# Patient Record
Sex: Female | Born: 1970 | Hispanic: Yes | Marital: Married | Smoking: Never smoker
Health system: Southern US, Community
[De-identification: ages and names within clinical notes are randomized; demographics above are authoritative.]

## PROBLEM LIST (undated history)

## (undated) DIAGNOSIS — F419 Anxiety disorder, unspecified: Secondary | ICD-10-CM

## (undated) DIAGNOSIS — D649 Anemia, unspecified: Secondary | ICD-10-CM

## (undated) DIAGNOSIS — M25511 Pain in right shoulder: Secondary | ICD-10-CM

## (undated) DIAGNOSIS — F32A Depression, unspecified: Secondary | ICD-10-CM

## (undated) DIAGNOSIS — R7303 Prediabetes: Secondary | ICD-10-CM

## (undated) DIAGNOSIS — F329 Major depressive disorder, single episode, unspecified: Secondary | ICD-10-CM

## (undated) DIAGNOSIS — G43909 Migraine, unspecified, not intractable, without status migrainosus: Secondary | ICD-10-CM

## (undated) DIAGNOSIS — N809 Endometriosis, unspecified: Secondary | ICD-10-CM

## (undated) DIAGNOSIS — Z973 Presence of spectacles and contact lenses: Secondary | ICD-10-CM

## (undated) DIAGNOSIS — R519 Headache, unspecified: Secondary | ICD-10-CM

## (undated) DIAGNOSIS — R51 Headache: Secondary | ICD-10-CM

## (undated) DIAGNOSIS — E785 Hyperlipidemia, unspecified: Secondary | ICD-10-CM

## (undated) HISTORY — DX: Endometriosis, unspecified: N80.9

## (undated) HISTORY — PX: TUBAL LIGATION: SHX77

## (undated) HISTORY — PX: ABLATION: SHX5711

## (undated) HISTORY — DX: Anemia, unspecified: D64.9

## (undated) HISTORY — DX: Migraine, unspecified, not intractable, without status migrainosus: G43.909

## (undated) HISTORY — DX: Anxiety disorder, unspecified: F41.9

---

## 1999-09-08 HISTORY — PX: TUBAL LIGATION: SHX77

## 2003-09-08 HISTORY — PX: CHOLECYSTECTOMY: SHX55

## 2006-09-07 HISTORY — PX: ABLATION: SHX5711

## 2011-08-19 ENCOUNTER — Other Ambulatory Visit (HOSPITAL_COMMUNITY): Payer: Self-pay | Admitting: Geriatric Medicine

## 2011-08-19 DIAGNOSIS — Z1231 Encounter for screening mammogram for malignant neoplasm of breast: Secondary | ICD-10-CM

## 2011-09-21 ENCOUNTER — Ambulatory Visit (HOSPITAL_COMMUNITY)
Admission: RE | Admit: 2011-09-21 | Discharge: 2011-09-21 | Disposition: A | Discharge status: Home / Self Care | Payer: Self-pay | Source: Ambulatory Visit | Attending: Geriatric Medicine | Admitting: Geriatric Medicine

## 2011-09-21 DIAGNOSIS — Z1231 Encounter for screening mammogram for malignant neoplasm of breast: Secondary | ICD-10-CM | POA: Insufficient documentation

## 2011-09-22 ENCOUNTER — Ambulatory Visit (HOSPITAL_COMMUNITY): Payer: Self-pay

## 2011-09-28 ENCOUNTER — Other Ambulatory Visit: Payer: Self-pay | Admitting: Geriatric Medicine

## 2011-09-28 DIAGNOSIS — R928 Other abnormal and inconclusive findings on diagnostic imaging of breast: Secondary | ICD-10-CM

## 2011-10-06 ENCOUNTER — Ambulatory Visit (INDEPENDENT_AMBULATORY_CARE_PROVIDER_SITE_OTHER): Payer: Self-pay | Admitting: *Deleted

## 2011-10-06 VITALS — BP 130/83 | HR 83 | Temp 97.4°F | Resp 18 | Ht 65.0 in | Wt 149.7 lb

## 2011-10-06 DIAGNOSIS — Z1239 Encounter for other screening for malignant neoplasm of breast: Secondary | ICD-10-CM

## 2011-10-06 NOTE — Patient Instructions (Signed)
Taught patient how to perform BSE and gave educational materials to take home. Patient did not need a Pap smear today due to last Pap smear was November 2012 at free Pap smear screening in [facility] per patient. Told patient about free cervical cancer screenings to receive a Pap smear if would like one this November or next Year per patient stated she has had an abnormal Pap smear in her past. Patient is scheduled for a left breast diagnostic mammogram this Thursday, October 08, 2011 at 1400. Patient aware of appointment and will be there. Let patient know will follow up with her within the next couple weeks with results. Patient verbalized understanding.

## 2011-10-06 NOTE — Progress Notes (Signed)
Pt here with spanish speaking interpreter [employer]

## 2011-10-06 NOTE — Progress Notes (Signed)
Complaints of tenderness in left breast. Referred from the [facility] for additional imaging of left breast.  Pap Smear:    Pap smear not performed today. Patients last Pap smear was November 2012 and was normal per patient. Last Pap smear was completed at the free Pap smear screening at [facility] offered by the Cancer Center. Per patient she had an abnormal Pap smear in 1999.  Suggested she go to one of the free Pap smear screenings offered by the Cancer Center either November of this year or next year due to her history and patient not sure what result were. No Pap smear results are in EPIC.  Physical exam: Breasts Breasts symmetrical. No skin abnormalities bilateral breasts. No nipple retraction bilateral breasts. No nipple discharge bilateral breasts. No lymphadenopathy. No lumps palpated bilateral breasts. Patient complained of tenderness when palpated outer quadrant of left breast. Patient referred to the [facility] for Diagnostic Mammogram and possible left breast ultrasound Thursday, October 08, 2011 at 1400 per recommendation by the [facility] from mammogram on 09/21/11 needing additional images of left breast.         Pelvic/Bimanual No Pap smear completed today since last Pap smear was November 2012 and normal per patient. Pap smear not indicated per BCCCP guidelines.

## 2011-10-08 ENCOUNTER — Ambulatory Visit
Admission: RE | Admit: 2011-10-08 | Discharge: 2011-10-08 | Disposition: A | Discharge status: Home / Self Care | Payer: No Typology Code available for payment source | Source: Ambulatory Visit | Attending: Geriatric Medicine | Admitting: Geriatric Medicine

## 2011-10-08 DIAGNOSIS — R928 Other abnormal and inconclusive findings on diagnostic imaging of breast: Secondary | ICD-10-CM

## 2011-10-16 ENCOUNTER — Telehealth: Payer: Self-pay | Admitting: *Deleted

## 2011-10-16 NOTE — Telephone Encounter (Signed)
Interpreter Tricia Lucas called patient to verify she received results from the [facility]. Patient is aware of her results and to have a screening mammogram in 1 year.

## 2014-05-03 ENCOUNTER — Emergency Department (HOSPITAL_BASED_OUTPATIENT_CLINIC_OR_DEPARTMENT_OTHER)
Admission: EM | Admit: 2014-05-03 | Discharge: 2014-05-03 | Disposition: A | Discharge status: Home / Self Care | Payer: Self-pay | Attending: Emergency Medicine | Admitting: Emergency Medicine

## 2014-05-03 ENCOUNTER — Encounter (HOSPITAL_BASED_OUTPATIENT_CLINIC_OR_DEPARTMENT_OTHER): Payer: Self-pay | Admitting: Emergency Medicine

## 2014-05-03 ENCOUNTER — Emergency Department (HOSPITAL_BASED_OUTPATIENT_CLINIC_OR_DEPARTMENT_OTHER): Payer: Self-pay

## 2014-05-03 DIAGNOSIS — Z79899 Other long term (current) drug therapy: Secondary | ICD-10-CM | POA: Insufficient documentation

## 2014-05-03 DIAGNOSIS — S199XXA Unspecified injury of neck, initial encounter: Secondary | ICD-10-CM

## 2014-05-03 DIAGNOSIS — S46819A Strain of other muscles, fascia and tendons at shoulder and upper arm level, unspecified arm, initial encounter: Secondary | ICD-10-CM | POA: Insufficient documentation

## 2014-05-03 DIAGNOSIS — S46909A Unspecified injury of unspecified muscle, fascia and tendon at shoulder and upper arm level, unspecified arm, initial encounter: Secondary | ICD-10-CM | POA: Insufficient documentation

## 2014-05-03 DIAGNOSIS — S0993XA Unspecified injury of face, initial encounter: Secondary | ICD-10-CM | POA: Insufficient documentation

## 2014-05-03 DIAGNOSIS — X58XXXA Exposure to other specified factors, initial encounter: Secondary | ICD-10-CM | POA: Insufficient documentation

## 2014-05-03 DIAGNOSIS — Y929 Unspecified place or not applicable: Secondary | ICD-10-CM | POA: Insufficient documentation

## 2014-05-03 DIAGNOSIS — S46811A Strain of other muscles, fascia and tendons at shoulder and upper arm level, right arm, initial encounter: Secondary | ICD-10-CM

## 2014-05-03 DIAGNOSIS — S4980XA Other specified injuries of shoulder and upper arm, unspecified arm, initial encounter: Secondary | ICD-10-CM | POA: Insufficient documentation

## 2014-05-03 DIAGNOSIS — F329 Major depressive disorder, single episode, unspecified: Secondary | ICD-10-CM | POA: Insufficient documentation

## 2014-05-03 DIAGNOSIS — Y939 Activity, unspecified: Secondary | ICD-10-CM | POA: Insufficient documentation

## 2014-05-03 DIAGNOSIS — F3289 Other specified depressive episodes: Secondary | ICD-10-CM | POA: Insufficient documentation

## 2014-05-03 HISTORY — DX: Headache: R51

## 2014-05-03 HISTORY — DX: Major depressive disorder, single episode, unspecified: F32.9

## 2014-05-03 HISTORY — DX: Headache, unspecified: R51.9

## 2014-05-03 HISTORY — DX: Depression, unspecified: F32.A

## 2014-05-03 MED ORDER — IBUPROFEN 800 MG PO TABS: 800.0000 mg | ORAL_TABLET | Freq: Three times a day (TID) | ORAL | Status: DC

## 2014-05-03 MED ORDER — HYDROCODONE-ACETAMINOPHEN 5-325 MG PO TABS
2.0000 | ORAL_TABLET | Freq: Once | ORAL | Status: AC
Start: 2014-05-03 — End: 2014-05-03
  Administered 2014-05-03: 2 via ORAL
  Filled 2014-05-03: qty 2

## 2014-05-03 MED ORDER — HYDROCODONE-ACETAMINOPHEN 5-325 MG PO TABS: 2.0000 | ORAL_TABLET | ORAL | Status: DC | PRN

## 2014-05-03 NOTE — Discharge Instructions (Signed)

## 2014-05-03 NOTE — ED Provider Notes (Signed)
CSN: [id number]     Arrival date & time 05/03/14  1341 History   First MD Initiated Contact with Patient 05/03/14 1402     Chief Complaint  Patient presents with  . Shoulder Pain     (Consider location/radiation/quality/duration/timing/severity/associated sxs/prior Treatment) Patient is a 43 y.o. female presenting with shoulder pain. The history is provided by the patient. No language interpreter was used.  Shoulder Pain This is a new problem. The current episode started yesterday. The problem occurs constantly. The problem has been gradually worsening. Associated symptoms include joint swelling, myalgias and neck pain. Nothing aggravates the symptoms. She has tried nothing for the symptoms. The treatment provided moderate relief.    Past Medical History  Diagnosis Date  . Depression   . Headache    Past Surgical History  Procedure Laterality Date  . Cesarean section     Family History  Problem Relation Age of Onset  . Diabetes Paternal Grandfather   . Diabetes Paternal Grandmother   . Diabetes Maternal Grandmother   . Diabetes Maternal Grandfather    History  Substance Use Topics  . Smoking status: Never Smoker   . Smokeless tobacco: Not on file  . Alcohol Use: No   OB History   Grav Para Term Preterm Abortions TAB SAB Ect Mult Living   2         2     Review of Systems  Musculoskeletal: Positive for joint swelling, myalgias and neck pain.  All other systems reviewed and are negative.     Allergies  Review of patient's allergies indicates no known allergies.  Home Medications   Prior to Admission medications   Medication Sig Start Date End Date Taking? Authorizing Provider  buPROPion (ZYBAN) 150 MG 12 hr tablet Take 150 mg by mouth 2 (two) times daily.   Yes Historical Provider, MD  Topiramate ER (TROKENDI XR) 50 MG CP24 Take by mouth.   Yes Historical Provider, MD   There were no vitals taken for this visit. Physical Exam  Nursing note and vitals  reviewed. Constitutional: She is oriented to person, place, and time. She appears well-developed and well-nourished.  HENT:  Head: Normocephalic and atraumatic.  Eyes: Pupils are equal, round, and reactive to light.  Neck: Normal range of motion.  Cardiovascular: Normal rate.   Pulmonary/Chest: Effort normal and breath sounds normal.  Abdominal: Soft.  Musculoskeletal: She exhibits tenderness.  Right shoulder  Minimally tender,  Pain posterior cervical muscles/trapezius/  Tender scapular area.  Neurological: She is alert and oriented to person, place, and time.  Skin: Skin is warm.  Psychiatric: She has a normal mood and affect.    ED Course  Procedures (including critical care time) Labs Review Labs Reviewed - No data to display  Imaging Review No results found.   EKG Interpretation None      MDM   Final diagnoses:  None    Chest xray negative   Rx for ibuprofen hydrocodone Follow up with Dr. Barbaraann Barthel for recheck  Fransico Meadow, PA-C 05/03/14 1535

## 2014-05-03 NOTE — ED Notes (Signed)
Right shoulder pain since last night. Worse when she takes a deep breath. No injury. Able to move her arm with no difficulty.

## 2014-05-07 NOTE — ED Provider Notes (Signed)
Medical screening examination/treatment/procedure(s) were performed by non-physician practitioner and as supervising physician I was immediately available for consultation/collaboration.   EKG Interpretation None        Malvin Johns, MD 05/07/14 [id number]

## 2014-07-09 ENCOUNTER — Encounter (HOSPITAL_BASED_OUTPATIENT_CLINIC_OR_DEPARTMENT_OTHER): Payer: Self-pay | Admitting: Emergency Medicine

## 2015-10-21 ENCOUNTER — Encounter (HOSPITAL_BASED_OUTPATIENT_CLINIC_OR_DEPARTMENT_OTHER): Payer: Self-pay | Admitting: Emergency Medicine

## 2015-10-21 ENCOUNTER — Emergency Department (HOSPITAL_BASED_OUTPATIENT_CLINIC_OR_DEPARTMENT_OTHER)
Admission: EM | Admit: 2015-10-21 | Discharge: 2015-10-22 | Disposition: A | Discharge status: Home / Self Care | Payer: Self-pay | Attending: Emergency Medicine | Admitting: Emergency Medicine

## 2015-10-21 ENCOUNTER — Emergency Department (HOSPITAL_BASED_OUTPATIENT_CLINIC_OR_DEPARTMENT_OTHER): Payer: Self-pay

## 2015-10-21 DIAGNOSIS — R519 Headache, unspecified: Secondary | ICD-10-CM

## 2015-10-21 DIAGNOSIS — R42 Dizziness and giddiness: Secondary | ICD-10-CM | POA: Insufficient documentation

## 2015-10-21 DIAGNOSIS — Z79899 Other long term (current) drug therapy: Secondary | ICD-10-CM | POA: Insufficient documentation

## 2015-10-21 DIAGNOSIS — Z3202 Encounter for pregnancy test, result negative: Secondary | ICD-10-CM | POA: Insufficient documentation

## 2015-10-21 DIAGNOSIS — F329 Major depressive disorder, single episode, unspecified: Secondary | ICD-10-CM | POA: Insufficient documentation

## 2015-10-21 DIAGNOSIS — R51 Headache: Secondary | ICD-10-CM | POA: Insufficient documentation

## 2015-10-21 LAB — CBC WITH DIFFERENTIAL/PLATELET
Basophils Absolute: 0 10*3/uL (ref 0.0–0.1)
Basophils Relative: 0 %
Eosinophils Absolute: 0.1 10*3/uL (ref 0.0–0.7)
Eosinophils Relative: 1 %
HCT: 43.5 % (ref 36.0–46.0)
Hemoglobin: 14.7 g/dL (ref 12.0–15.0)
Lymphocytes Relative: 34 %
Lymphs Abs: 3.5 10*3/uL (ref 0.7–4.0)
MCH: 30.7 pg (ref 26.0–34.0)
MCHC: 33.8 g/dL (ref 30.0–36.0)
MCV: 90.8 fL (ref 78.0–100.0)
Monocytes Absolute: 0.7 10*3/uL (ref 0.1–1.0)
Monocytes Relative: 7 %
Neutro Abs: 6.1 10*3/uL (ref 1.7–7.7)
Neutrophils Relative %: 58 %
Platelets: 328 10*3/uL (ref 150–400)
RBC: 4.79 MIL/uL (ref 3.87–5.11)
RDW: 12.6 % (ref 11.5–15.5)
WBC: 10.4 10*3/uL (ref 4.0–10.5)

## 2015-10-21 LAB — BASIC METABOLIC PANEL
Anion gap: 13 (ref 5–15)
BUN: 11 mg/dL (ref 6–20)
CO2: 24 mmol/L (ref 22–32)
Calcium: 9.7 mg/dL (ref 8.9–10.3)
Chloride: 102 mmol/L (ref 101–111)
Creatinine, Ser: 0.82 mg/dL (ref 0.44–1.00)
GFR calc Af Amer: >60 mL/min (ref >60)
GFR calc non Af Amer: >60 mL/min (ref >60)
Glucose, Bld: 119 mg/dL — ABNORMAL HIGH (ref 65–99)
Potassium: 3.5 mmol/L (ref 3.5–5.1)
Sodium: 139 mmol/L (ref 135–145)

## 2015-10-21 LAB — HCG, QUANTITATIVE, PREGNANCY: hCG, Beta Chain, Quant, S: <1 m[IU]/mL (ref <5)

## 2015-10-21 MED ORDER — DIPHENHYDRAMINE HCL 50 MG/ML IJ SOLN
12.5000 mg | Freq: Once | INTRAMUSCULAR | Status: AC
  Administered 2015-10-21: 12.5 mg via INTRAVENOUS
  Filled 2015-10-21: qty 1

## 2015-10-21 MED ORDER — ONDANSETRON HCL 4 MG/2ML IJ SOLN
4.0000 mg | Freq: Once | INTRAMUSCULAR | Status: AC
  Administered 2015-10-21: 4 mg via INTRAVENOUS
  Filled 2015-10-21: qty 2

## 2015-10-21 MED ORDER — METOCLOPRAMIDE HCL 5 MG/ML IJ SOLN
10.0000 mg | Freq: Once | INTRAMUSCULAR | Status: AC
  Administered 2015-10-21: 10 mg via INTRAVENOUS
  Filled 2015-10-21: qty 2

## 2015-10-21 NOTE — ED Notes (Signed)
Pt placed on auto vitals Q30.  

## 2015-10-21 NOTE — ED Provider Notes (Signed)
CSN: [id number]     Arrival date & time 10/21/15  2218 History   First MD Initiated Contact with Patient 10/21/15 2256     Chief Complaint  Patient presents with  . Migraine    HPI   Tricia Lucas is a 45 y.o. female with a PMH of depression, headaches who presents to the ED with headache, which she states started yesterday and has progressively worsened since that time. She reports her symptoms have been constant. She denies exacerbating factors. She denies fever, chills. She notes associated lightheadedness. She denies dizziness, vision changes, neck pain. She states she has a history of headaches, however has not had a headache since she was 45 years old.   Past Medical History  Diagnosis Date  . Depression   . Headache    Past Surgical History  Procedure Laterality Date  . Cesarean section    . Ablation     Family History  Problem Relation Age of Onset  . Diabetes Paternal Grandfather   . Diabetes Paternal Grandmother   . Diabetes Maternal Grandmother   . Diabetes Maternal Grandfather    Social History  Substance Use Topics  . Smoking status: Never Smoker   . Smokeless tobacco: None  . Alcohol Use: No   OB History    Gravida Para Term Preterm AB TAB SAB Ectopic Multiple Living   2         2       Review of Systems  Constitutional: Negative for fever and chills.  Musculoskeletal: Negative for neck pain.  Neurological: Positive for light-headedness and headaches. Negative for dizziness, syncope, weakness and numbness.  All other systems reviewed and are negative.     Allergies  Review of patient's allergies indicates no known allergies.  Home Medications   Prior to Admission medications   Medication Sig Start Date End Date Taking? Authorizing Provider  buPROPion (ZYBAN) 150 MG 12 hr tablet Take 150 mg by mouth 2 (two) times daily.    Historical Provider, MD  HYDROcodone-acetaminophen (NORCO/VICODIN) 5-325 MG per tablet Take 2 tablets by mouth every 4 (four)  hours as needed. 05/03/14   Fransico Meadow, PA-C  ibuprofen (ADVIL,MOTRIN) 800 MG tablet Take 1 tablet (800 mg total) by mouth 3 (three) times daily. 05/03/14   Fransico Meadow, PA-C  Topiramate ER (TROKENDI XR) 50 MG CP24 Take by mouth.    Historical Provider, MD    BP 126/77 mmHg  Pulse 82  Temp(Src) 97.9 F (36.6 C) (Oral)  Resp 18  Ht 5\' 5"  (1.651 m)  Wt 74.844 kg  BMI 27.46 kg/m2  SpO2 100% Physical Exam  Constitutional: She is oriented to person, place, and time. She appears well-developed and well-nourished. No distress.  Patient appears uncomfortable due to pain.  HENT:  Head: Normocephalic and atraumatic.  Right Ear: External ear normal.  Left Ear: External ear normal.  Nose: Nose normal.  Mouth/Throat: Uvula is midline, oropharynx is clear and moist and mucous membranes are normal.  Eyes: Conjunctivae, EOM and lids are normal. Pupils are equal, round, and reactive to light. Right eye exhibits no discharge. Left eye exhibits no discharge. No scleral icterus.  Neck: Normal range of motion. Neck supple.  Cardiovascular: Normal rate, regular rhythm, normal heart sounds, intact distal pulses and normal pulses.   Pulmonary/Chest: Effort normal and breath sounds normal. No respiratory distress. She has no wheezes. She has no rales.  Abdominal: Soft. Normal appearance and bowel sounds are normal. She exhibits no distension  and no mass. There is no tenderness. There is no rigidity, no rebound and no guarding.  Musculoskeletal: Normal range of motion. She exhibits no edema or tenderness.  Neurological: She is alert and oriented to person, place, and time. She has normal strength. No cranial nerve deficit or sensory deficit.  Skin: Skin is warm, dry and intact. No rash noted. She is not diaphoretic. No erythema. No pallor.  Psychiatric: She has a normal mood and affect. Her speech is normal and behavior is normal.  Nursing note and vitals reviewed.   ED Course  Procedures (including  critical care time)  Labs Review Labs Reviewed  BASIC METABOLIC PANEL - Abnormal; Notable for the following:    Glucose, Bld 119 (*)    All other components within normal limits  CBC WITH DIFFERENTIAL/PLATELET  HCG, QUANTITATIVE, PREGNANCY    Imaging Review Ct Head Wo Contrast  10/22/2015  CLINICAL DATA:  Severe headache for 2 days. Nausea and vomiting. No history of injury. EXAM: CT HEAD WITHOUT CONTRAST TECHNIQUE: Contiguous axial images were obtained from the base of the skull through the vertex without intravenous contrast. COMPARISON:  None. FINDINGS: Ventricles and sulci appear symmetrical. No mass effect or midline shift. No abnormal extra-axial fluid collections. Gray-white matter junctions are distinct. Basal cisterns are not effaced. No evidence of acute intracranial hemorrhage. No depressed skull fractures. Visualized paranasal sinuses and mastoid air cells are not opacified. IMPRESSION: No acute intracranial abnormalities. Electronically Signed   By: Lucienne Capers M.D.   On: 10/22/2015 00:18   I have personally reviewed and evaluated these images and lab results as part of my medical decision-making.   EKG Interpretation None      MDM   Final diagnoses:  Headache    45 year old female presents with headache, which she states started yesterday and has progressively worsened since that time (no thunderclap onset). Notes she has a history of headaches, though has not had a migraine since she was 46 years old. Denies fever, chills, dizziness, vision changes, neck pain.   Patient is afebrile. Vital signs stable. Normal neuro exam with no focal deficit. No nuchal rigidity. CBC negative for leukocytosis or anemia. BMP unremarkable. HCG negative.  Head CT negative for acute intracranial abnormalities.  Given fluids, benadryl, reglan, toradol. On reassessment of patient, she reports symptom resolution.  Symptoms likely due to migraine. Low suspicion for ICH, SAH, meningitis.  Patient is non-toxic and well-appearing, feel she is stable for discharge at this time. Patient to follow-up with PCP. Return precautions discussed. Patient verbalizes her understanding and is in agreement with plan.  BP 126/77 mmHg  Pulse 82  Temp(Src) 97.9 F (36.6 C) (Oral)  Resp 18  Ht 5\' 5"  (1.651 m)  Wt 74.844 kg  BMI 27.46 kg/m2  SpO2 100%       Marella Chimes, PA-C 10/22/15 0149  Shanon Rosser, MD 10/22/15 [id number]

## 2015-10-21 NOTE — ED Notes (Signed)
Patient transported to CT 

## 2015-10-21 NOTE — ED Notes (Signed)
History of migraines. States this time since yesterday. Has active vomiting at triage.

## 2015-10-21 NOTE — ED Notes (Signed)
Pt c/o migraine type headache since yesterday that is not relieved by her regular migraine medication sumitriptan, pt actively vomiting in assessment.

## 2015-10-22 MED ORDER — KETOROLAC TROMETHAMINE 30 MG/ML IJ SOLN
30.0000 mg | Freq: Once | INTRAMUSCULAR | Status: AC
  Administered 2015-10-22: 30 mg via INTRAVENOUS
  Filled 2015-10-22: qty 1

## 2015-10-22 NOTE — ED Notes (Signed)
Pt verbalizes understanding of d/c instructions and denies any further needs at this time. 

## 2015-10-22 NOTE — Discharge Instructions (Signed)
1. Medications: tylenol or ibuprofen for headache, usual home medications 2. Treatment: rest, drink plenty of fluids 3. Follow Up: please followup with your primary doctor for discussion of your diagnoses and further evaluation after today's visit; please follow-up with neurology for recurrent symptoms; if you do not have a primary care doctor use the resource guide provided to find one; please return to the ER for high fever, severe headache, new or worsening symptoms   Cefalea migraosa (Migraine Headache) [place] cefalea migraosa es un dolor muy intenso y punzante en uno o ambos lados de la cabeza. Hable con su mdico [employer] factores que pueden causar Medical illustrator) las Psychologist, occupational. [place] solo los [employer] segn le haya indicado el mdico.  Cuando tenga la migraa, acustese en un cuarto oscuro y tranquilo  Lleve un registro diario para averiguar si hay ciertas cosas que le provocan la cefalea migraosa. Por ejemplo, escriba:  Lo que usted come y bebe.  Cunto tiempo duerme.  Algn cambio en su dieta o en los medicamentos.  Beba menos alcohol.  Si fuma, deje de hacerlo.  Duerma lo suficiente.  Disminuya todo tipo de estrs de la vida diaria.  [place] luces tenues si le [employer] luces brillantes o hacen que la [place]. SOLICITE AYUDA DE INMEDIATO SI:   La migraa empeora.  Tiene fiebre.  Presenta rigidez en el cuello.  Tiene dificultad para ver.  Sus msculos estn dbiles, o pierde el control muscular.  Pierde el equilibrio o tiene problemas para Writer.  Siente que se desvanece (debilidad) o se desmaya.  Tiene malos sntomas que son diferentes a los primeros sntomas. ASEGRESE DE QUE:   Comprende estas instrucciones.  Controlar su afeccin.  Recibir ayuda de inmediato si no mejora o si empeora.   Esta informacin no tiene Marine scientist el consejo del mdico. Asegrese de hacerle al mdico cualquier  pregunta que tenga.   Document Released: 11/20/2008 Document Revised: 08/29/2013 Elsevier Interactive Patient Education [employer].   Emergency Department Resource Guide 1) Find a Doctor and Pay Out of Pocket Although you won't have to find out who is covered by your insurance plan, it is a good idea to ask around and get recommendations. You will then need to call the office and see if the doctor you have chosen will accept you as a new patient and what types of options they offer for patients who are self-pay. Some doctors offer discounts or will set up payment plans for their patients who do not have insurance, but you will need to ask so you aren't surprised when you get to your appointment.  2) Contact Your Local Health Department Not all health departments have doctors that can see patients for sick visits, but many do, so it is worth a call to see if yours does. If you don't know where your local health department is, you can check in your phone book. The CDC also has a tool to help you locate your state's health department, and many state websites also have listings of all of their local health departments.  3) Find a [facility] If your illness is not likely to be very severe or complicated, you may want to try a walk in clinic. These are popping up all over the country in pharmacies, drugstores, and shopping centers. They're usually staffed by nurse practitioners or physician assistants that have been trained to treat common illnesses and complaints. They're usually fairly quick and inexpensive.  However, if you have serious medical issues or chronic medical problems, these are probably not your best option.  No Primary Care Doctor: - Call Health Connect at  [phone] - they can help you locate a primary care doctor that  accepts your insurance, provides certain services, etc. - Physician Referral Service- [phone]  Chronic Pain Problems: Organization          Address  Phone   Notes  [facility]  [phone] Patients need to be referred by their primary care doctor.   Medication Assistance: Organization         Address  Phone   Notes  [facility] Medication [facility] [address] [place], [place] [address] [phone] --Must be a resident of [facility] -- Must have NO insurance coverage whatsoever (no Medicaid/ Medicare, etc.) -- The pt. MUST have a primary care doctor that directs their care regularly and follows them in the community   MedAssist  [phone]   [employer]  [phone]    Agencies that provide inexpensive medical care: Organization         Address  Phone   Notes  [place]  [phone]   [facility]    [phone]   [facility] [place], [place] [address] [phone]   [place] [address] [place] [phone]   Planned Parenthood    [phone]   [facility]    [phone]   [place] and [place] [address], [place] Phone:  [phone], Fax:  [phone] Hours of Operation:  9 am - 6 pm, M-F.  Also accepts Medicaid/Medicare and self-pay.  [facility] for [place] [address], [address], [place] Phone: [phone], Fax: [phone]. Hours of Operation:  8:30 am - 5:30 pm, M-F.  Also accepts Medicaid and self-pay.  [facility] [place] [address], [place] Phone: [phone]   [place], [place], [place] [phone], Ext. [phone] Mondays & Thursdays: 7-9 AM.  First 15 patients are seen on a first come, first serve basis.    [place] Providers:  Organization         Address  Phone   Notes  [facility] [address], [place] [phone] Also accepts self-pay patients.  [facility] [id number] [place], [place]  [phone]   [place], [address], [place] [phone]   [facility] [address] [place] [phone]   Lucianne Lei [address], [place]   [phone] Only accepts [place] Access [place] patients after they have their name applied to their card.   Self-Pay (no insurance) in [facility]:  Organization         Address  Phone   Notes  Sickle Cell Patients, [facility] [place] [phone]   [facility] Care [place] [phone]   [facility] [place]  [place], [place], [place] [phone]   [facility]/Dr. Osei-Bonsu  [address] [place] or [address], [place] [phone] Phone number for both [place] and [place] locations is the same.  [facility] [address], [place] [phone]   [facility] [address], [place] or [address] [phone]   [facility] [address] [place] [phone], phone; [phone], fax Sees patients 1st and 3rd Saturday of every month.  Must not qualify for public or private insurance (i.e. Medicaid, Medicare, [place] Health Choice, Veterans' Benefits)  Household income should be no more than 200% of the poverty level The clinic cannot treat you if you are pregnant or think you are pregnant  Sexually transmitted diseases are not treated at the clinic.    Dental Care: Organization         Address  Phone  Notes  [facility] Department of [facility] [place] [phone] Accepts children up to age 77 who are enrolled in [place] or [place]; pregnant women with a Medicaid card; and  children who have applied for Medicaid or [place] Health Choice, but were declined, whose parents can pay a reduced fee at time of service.  [facility] Department of [facility]  [address], [place] [phone] Accepts children up to age 23 who are enrolled in [place] or [place]; pregnant women with a Medicaid card; and children who have applied for Medicaid or [place] Health Choice, but were declined, whose parents can pay a reduced fee at time of service.  [place] Adult Dental Access PROGRAM  [place] [phone] Patients are seen by appointment only. Walk-ins are not accepted. [place] will see patients 50 years of age and older. Monday - Tuesday (8am-5pm) Most Wednesdays (8:30-5pm) $30 per visit, cash only  [facility] Adult Dental Access PROGRAM  [address], [facility] [phone] Patients are seen by appointment only. Walk-ins are not accepted. [place] will see patients 32 years of age and older. One Wednesday Evening (Monthly: Volunteer Based).  $30 per visit, cash only  [place]  [phone] for adults; Children under age 52, call Graduate Pediatric Dentistry at [phone]. Children aged 73-14, please call [phone] to request a pediatric application.  Dental services are provided in all areas of dental care including fillings, crowns and bridges, complete and partial dentures, implants, gum treatment, root canals, and extractions. Preventive care is also provided. Treatment is provided to both adults and children. Patients are selected via a lottery and there is often a waiting list.   [facility] [address], [place]  [phone] [email or web address]   [facility] [address] [place], [place] [phone], Ext. [phone] Second and Fourth Thursday of each month, opens at 6:30 AM; Clinic ends at 9 AM.  Patients are seen on a first-come first-served  basis, and a limited number are seen during each clinic.   [facility]  [address] [place] Danker [place], [place] [phone]   Eligibility Requirements You must have lived in [place], [place], or [place] counties for at least the last three months.   You cannot be eligible for state or federal sponsored [employer], including [employer], [place], or [employer].   You generally cannot be eligible for healthcare insurance through your employer.    How to apply: Eligibility screenings are held every Tuesday and Wednesday afternoon from 1:00 pm until 4:00 pm. You  do not need an appointment for the interview!  [facility] [address] [place], [place]   [place]  [place] Bar Department  [place]  [phone]    Behavioral Health Resources in the Community: Intensive Outpatient Programs Organization         Address  Phone  Notes  [place]. [address], [place], [place] [phone]   [facility] [address] [place], [place]   ADS: Alcohol & Drug Svcs [address], [place], [place]   [place] [address]  [place], [place] or [phone]   Substance Abuse Resources Organization         Address  Phone  Notes  Alcohol and Drug Services  [phone]   [place]  [phone]   The [place]   Chinita Pester  [phone]   Residential & Outpatient Substance Abuse Program  [phone]   Psychological Services Organization         Address  Phone  Notes  [facility] [place]  [place]  [phone]   [place] [address] or [phone]    Mobile Crisis Teams Organization          Address  Phone  Notes  [facility]rapeutic [facility]atives, Mobile Crisis Care Unit  [phone]   Assertive Psychotherapeutic Services  [address] [place], [place]   [place] Levels [address], [place] [phone]    Self-Help/Support Groups Organization         Address  Phone             Notes  [place]. of [place] - variety of support groups  [place] Call for more information  Narcotics Anonymous (NA), [facility] [address], [employer] [place]  2 meetings at this location   Special educational needs teacher         Address  Phone  Notes  [facility]AP Residential Treatment [place],    [place]  [phone]   [facility]  [address], [place] [id number], [place], [place]   [place], [place] [phone] Admissions: 8am-3pm M-F  Incentives Substance [place] [address]    [place], [place] [phone]   [facility] [address] [place], [place], [place]   [facility] [address]  [place], [place]   Insight Programs - Intensive Outpatient [address] Krist[address], [place], [place]   [facility] ([place] [address].,  [place], [place] [phone] or [phone]   Residential Treatment Services (RTS) [address], [place], [place] Accepts Medicaid  [facility] [place] [address],  [place] [phone] Substance Abuse/Addiction Treatment   [facility] Organization         Address  Phone  Notes  [facility]  [phone]   Domenic Schwab, PhD [address] [place], [place]   [phone] or [phone]   [place], [place] [phone]   [place] [address] [place], [place] [phone] Insurance/Medicaid/sponsorship  through [employer] and Families [address] [id number]  [place], [place] [phone] [place], [place] [phone]    Dr. Adele Schilder  [phone]   [facility] Dept. 1) [address] [place] 2) [place] 3)  [place] [address], [place] [phone]  [phone]   [place] [phone] or [phone] (After Hours)

## 2017-07-14 ENCOUNTER — Encounter (HOSPITAL_COMMUNITY): Payer: Self-pay

## 2019-12-15 ENCOUNTER — Other Ambulatory Visit: Payer: Self-pay

## 2019-12-15 DIAGNOSIS — N644 Mastodynia: Secondary | ICD-10-CM

## 2019-12-21 ENCOUNTER — Ambulatory Visit: Payer: Self-pay

## 2019-12-21 ENCOUNTER — Ambulatory Visit
Admission: RE | Admit: 2019-12-21 | Discharge: 2019-12-21 | Disposition: A | Discharge status: Home / Self Care | Payer: Self-pay | Source: Ambulatory Visit | Attending: Obstetrics and Gynecology | Admitting: Obstetrics and Gynecology

## 2019-12-21 ENCOUNTER — Other Ambulatory Visit: Payer: Self-pay

## 2019-12-21 VITALS — BP 118/74 | Temp 98.2°F | Wt 154.0 lb

## 2019-12-21 DIAGNOSIS — Z124 Encounter for screening for malignant neoplasm of cervix: Secondary | ICD-10-CM

## 2019-12-21 DIAGNOSIS — Z1239 Encounter for other screening for malignant neoplasm of breast: Secondary | ICD-10-CM

## 2019-12-21 DIAGNOSIS — N949 Unspecified condition associated with female genital organs and menstrual cycle: Secondary | ICD-10-CM

## 2019-12-21 DIAGNOSIS — Z789 Other specified health status: Secondary | ICD-10-CM

## 2019-12-21 DIAGNOSIS — N644 Mastodynia: Secondary | ICD-10-CM

## 2019-12-21 NOTE — Progress Notes (Signed)
[name] is a 49 y.o. G2P0 female who presents to [facility] clinic today for clinical breast exam and pap smear.    Breast History: She reports right breast pain that has been present for the past 2-3 weeks.  She describes the pain as a burning sensation that is constant with a feeling of stinging when driving. Patient further describes the sensation as being similar to milk let down without the leaking.  She rates the pain a 7/10 and finds no relief with tylenol, but the pain does improve with cold compresses and is a 3/10.     Reproductive History: Pap smear completed today. Last Pap smear was 2014 at a [facility] and was normal. Patient denies a history of an abnormal Pap smear. Last Pap smear result is available in Epic via care everywhere.  Patient reports she had an ablation in 2008 and has been having dark brown discharge.  She also reports a diagnosis of endometriosis in 2010 with pain and cramping on her right side every month.  However, patient reports pain is relieved with tylenol.  Patient endorses pain and discomfort during sexual activity.   Physical exam: Breasts Breasts symmetrical. No skin abnormalities bilateral breasts. No nipple retraction bilateral breasts. No nipple discharge bilateral breasts. No lymphadenopathy. No lumps palpated bilateral breasts.  Tenderness in right lower outside quadrant, no masses palpated.  Tenderness in right lower inside quadrant at ~4'oclock near nipple.  Marker placed.      Speculum/Pelvic Exam: Ext Genitalia No lesions, no swelling and no discharge observed on external genitalia.        Vagina Vagina pink and normal texture. No lesions or discharge observed in vagina.        Cervix Cervix is present. Cervix pink and of normal texture. Moderate amt clear mucoid discharge observed. Pap collected via spatula and brush.   Bimanual Exam: Uterus Uterus is present and palpable. Uterus in normal position and normal size.   +CMT    Adnexae UTA  ovaries d/t position.  Tenderness noted in cul de sac.     Rectovaginal No rectal exam completed today since patient had no rectal complaints. No skin abnormalities observed on exam.     Smoking History: Patient has never smoked.    Patient Navigation: Patient education provided. Access to services provided for patient through [place] program.  Colorectal Cancer Screening: Per patient has never had colonoscopy completed No complaints today.    Breast and Cervical Cancer Risk Assessment: Patient does not have family history of breast cancer, known genetic mutations, or radiation treatment to the chest before age 34. Patient does not have history of cervical dysplasia, immunocompromised, or DES exposure in-utero.  Risk Assessment    No risk assessment data      A: BCCCP Exam  Clinical Breast Exam Cervical Cancer Screen Right Breast Pain +CMT Language Barrier  P: -Referred patient to the [facility] for a diagnostic mammogram. Appointment scheduled April 15 at 210pm. -Discussed breast exam findings.  Informed that no masses palpated and right outside tenderness is likely musculoskeletal as location is nearer to ribs. -Reassured that occ. dark brown bleeding s/p ablation is normal, but to report any bleeding or passing of clots.  -Reassured that some pain and cramping is anticipated with endometriosis diagnosis, but should report if not relieved with usual methods. -Reviewed CMT and recommendation given for STD testing at [facility].  Informed that this could be contributing to pain and discomfort. -Spanish interpretations completed by in-person  assistance: Erika.  -Given information regarding pap results.  Gavin Pound, [place] 12/21/2019 12:18 PM

## 2019-12-25 ENCOUNTER — Telehealth: Payer: Self-pay

## 2019-12-25 NOTE — Telephone Encounter (Signed)
Via Lavon Paganini, Spanish Interpreter, Patient informed pap/HPV is negative, repeat pap in 5 years. Patient verbalized understanding.

## 2020-01-09 LAB — CYTOLOGY - PAP
Comment: NEGATIVE
Diagnosis: NEGATIVE
High risk HPV: NEGATIVE

## 2020-01-11 ENCOUNTER — Other Ambulatory Visit: Payer: Self-pay

## 2020-01-11 DIAGNOSIS — Z0000 Encounter for general adult medical examination without abnormal findings: Secondary | ICD-10-CM

## 2020-01-15 ENCOUNTER — Other Ambulatory Visit: Payer: Self-pay

## 2020-01-15 ENCOUNTER — Inpatient Hospital Stay: Payer: Self-pay | Attending: Obstetrics and Gynecology | Admitting: *Deleted

## 2020-01-15 VITALS — BP 122/78 | Temp 97.3°F | Ht 65.0 in | Wt 155.0 lb

## 2020-01-15 DIAGNOSIS — Z0000 Encounter for general adult medical examination without abnormal findings: Secondary | ICD-10-CM

## 2020-01-16 LAB — HEMOGLOBIN A1C
Est. average glucose Bld gHb Est-mCnc: 111 mg/dL
Hgb A1c MFr Bld: 5.5 % (ref 4.8–5.6)

## 2020-01-16 LAB — LIPID PANEL
Chol/HDL Ratio: 3 ratio (ref 0.0–4.4)
Cholesterol, Total: 202 mg/dL — ABNORMAL HIGH (ref 100–199)
HDL: 67 mg/dL (ref >39)
LDL Chol Calc (NIH): 111 mg/dL — ABNORMAL HIGH (ref 0–99)
Triglycerides: 138 mg/dL (ref 0–149)
VLDL Cholesterol Cal: 24 mg/dL (ref 5–40)

## 2020-01-16 LAB — GLUCOSE, RANDOM: Glucose: 81 mg/dL (ref 65–99)

## 2020-01-16 NOTE — Progress Notes (Signed)
[facility] initial screening   Interpreter- Rudene [place], [place]   Clinical Measurement:  Vitals:   01/15/20 0840 01/15/20 0900  BP: 122/76 122/78  Temp: (!) 97.3 F (36.3 C)    Fasting Labs Drawn Today, will review with patient when they result.   Medical History:  Patient states that she does not have high cholesterol, does not have high blood pressure and she does not have diabetes.  Medications:  Patient states that she does not take medication to lower cholesterol, blood pressure and blood sugar.  Patient does not take an aspirin a day to help prevent a heart attack or stroke. Patient states that she does not take medication to lower her cholesterol, blood pressure or blood sugar.   Blood pressure, self measurement: Patient states that she does not measure blood pressure from home. She checks her blood pressure N/A. She shares her readings with a health care provider: N/A.   Nutrition: Patient states that on average she eats 2 cups of fruit and 0 cups of vegetables per day. Patient states that she does not eat fish at least 2 times per week. Patient eats less than half servings of whole grains. Patient drinks less than 36 ounces of beverages with added sugar weekly: yes. Patient is currently watching sodium or salt intake: yes. In the past 7 days patient has had 0 drinks containing alcohol. On average patient drinks 0 drinks containing alcohol per day.      Physical activity:  Patient states that she gets 300 minutes of moderate and 0 minutes of vigorous physical activity each week.  Smoking status:  Patient states that she has has never smoked .   Quality of life:  Over the past 2 weeks patient states that she had little interest or pleasure in doing things: not at all. She has been feeling down, depressed or hopeless:several days.    Risk reduction and counseling:   Health Coaching:  Explained to the patient that the recommendation is for 2 cups of fruit and 3 cups of vegetables  per day. Showed the patient what one serving would look like. Discussed adding more heart healthy fish into weekly diet. Gave examples of salmon, tuna, mackerel and sardines to try. Also spoke with patient about adding more whole grains into daily diet. Gave examples of brown rice, whole wheat pasta, whole wheat/whole grain bread and whole grain cereals. Patient currently walks and does yoga on average 5 days a week for 60 minutes at a time. Encouraged patient to continue with current exercise routine.    Navigation:  I will notify patient of lab results.  Patient is aware of 2 more health coaching sessions and a follow up.  Time: 25 minutes

## 2020-01-22 ENCOUNTER — Telehealth: Payer: Self-pay

## 2020-01-22 NOTE — Telephone Encounter (Signed)
Health coaching 2   [facility] [phone]   [place] cholesterol, 111 LDL cholesterol, 138 triglycerides, 67 HDL cholesterol , 5.5 hemoglobin A1C , 81 mean plasma glucose. Patient understands and is aware of her lab results.   Goals-  Discussed lab results with patient and answered any questions that the patient had regarding results.  Reduce the amount of fried and fatty foods consumed. Try to grill, bake, broil or sautee foods instead. Try to switch to low-fat or reduce-fat daily products. Add in more whole grains in daily diet (whole wheat bread, brown rice, whole wheat pasta, oatmeal or whole grain cereals). Add more heart healthy fish in weekly diet (salmon, tuna, mackerel or sardines). Continue with exercise routine.    Navigation:  Patient is aware of 1 more health coaching sessions and a follow up. Patient is scheduled for follow-up with Internal Medicine on Wednesday, May 19th @10 :15 am for elevated labs.   Time- 12 minutes

## 2020-01-24 ENCOUNTER — Ambulatory Visit (INDEPENDENT_AMBULATORY_CARE_PROVIDER_SITE_OTHER): Payer: Self-pay | Admitting: Internal Medicine

## 2020-01-24 ENCOUNTER — Other Ambulatory Visit: Payer: Self-pay

## 2020-01-24 ENCOUNTER — Encounter: Payer: Self-pay | Admitting: Internal Medicine

## 2020-01-24 DIAGNOSIS — N809 Endometriosis, unspecified: Secondary | ICD-10-CM | POA: Insufficient documentation

## 2020-01-24 DIAGNOSIS — E782 Mixed hyperlipidemia: Secondary | ICD-10-CM

## 2020-01-24 DIAGNOSIS — E785 Hyperlipidemia, unspecified: Secondary | ICD-10-CM | POA: Insufficient documentation

## 2020-01-24 NOTE — Progress Notes (Signed)
   CC: Cholesterol  HPI: Ms.Tricia Lucas is a 49 y.o. with PMH listed below presenting with complaint of high cholesterol. Please see problem based assessment and plan for further details.  Past Medical History:  Diagnosis Date  . Depression   . Headache    Past Surgical History C-section  Family History Multiple family members (parents, siblings) with diabetes  Social History Works part time in housekeeping. Denies any alcohol, tobacco, illicit substance use. Lives with husband and children   Review of Systems: Review of Systems  Constitutional: Negative for chills, fever and malaise/fatigue.  Eyes: Negative for blurred vision.  Respiratory: Negative for shortness of breath.   Cardiovascular: Negative for chest pain, palpitations and leg swelling.  Gastrointestinal: Negative for constipation, diarrhea, nausea and vomiting.  Neurological: Negative for dizziness, tingling, sensory change and weakness.  All other systems reviewed and are negative.    Physical Exam: Vitals:   01/24/20 1034  BP: 116/65  Pulse: 75  Temp: 98.4 F (36.9 C)  TempSrc: Oral  SpO2: 100%  Weight: 156 lb 1.6 oz (70.8 kg)    Physical Exam  Constitutional: She is oriented to person, place, and time. She appears well-developed and well-nourished. No distress.  Cardiovascular: Normal rate, regular rhythm, normal heart sounds and intact distal pulses.  No murmur heard. Respiratory: Effort normal and breath sounds normal. She has no wheezes. She has no rales.  GI: Soft. Bowel sounds are normal. She exhibits no distension. There is no abdominal tenderness.  Musculoskeletal:        General: No edema. Normal range of motion.  Neurological: She is alert and oriented to person, place, and time.  Skin: Skin is warm and dry.    Assessment & Plan:   Endometriosis Describes hx of endometriosis with significant associated pain in the past. Currently asymptomatic. Follows with Ob/Gyn when able through  assistance programs.  HLD (hyperlipidemia) Tricia Lucas is a 49 yo F w/ PMH of endometriosis presenting to [facility] to establish care after being found to have high cholesterol. She mentions that she does not have any family hx of early heart disease and denies smoking. She mentions diet heavy in meats and cheese as she eats lot of [place] food. She denies any chest pain, dyspnea, palpitations. She states she is primarily concerned about developing diabetes due to her family history.  A/P Lipid Panel     Component Value Date/Time   CHOL 202 (H) 01/15/2020 1357   TRIG 138 01/15/2020 1357   HDL 67 01/15/2020 1357   CHOLHDL 3.0 01/15/2020 1357   LDLCALC 111 (H) 01/15/2020 1357   LABVLDL 24 01/15/2020 1357   Presents to establish care for high cholesterol. Most recent lipid panel reviewed. ASCVD risk of 0.7%. No indication for statin therapy at this time. - Discussed healthy diet and lifestyle modifications to prevent progression of hyperlipidemia.    Patient discussed with Dr. Evette Doffing   -Gilberto Better, PGY2 [facility] Pager: [phone]

## 2020-01-24 NOTE — Assessment & Plan Note (Signed)
Describes hx of endometriosis with significant associated pain in the past. Currently asymptomatic. Follows with Ob/Gyn when able through assistance programs.

## 2020-01-24 NOTE — Assessment & Plan Note (Signed)
Tricia Lucas is a 49 yo F w/ PMH of endometriosis presenting to [facility] to establish care after being found to have high cholesterol. She mentions that she does not have any family hx of early heart disease and denies smoking. She mentions diet heavy in meats and cheese as she eats lot of [place] food. She denies any chest pain, dyspnea, palpitations. She states she is primarily concerned about developing diabetes due to her family history.  A/P Lipid Panel     Component Value Date/Time   CHOL 202 (H) 01/15/2020 1357   TRIG 138 01/15/2020 1357   HDL 67 01/15/2020 1357   CHOLHDL 3.0 01/15/2020 1357   LDLCALC 111 (H) 01/15/2020 1357   LABVLDL 24 01/15/2020 1357   Presents to establish care for high cholesterol. Most recent lipid panel reviewed. ASCVD risk of 0.7%. No indication for statin therapy at this time. - Discussed healthy diet and lifestyle modifications to prevent progression of hyperlipidemia.

## 2020-01-24 NOTE — Patient Instructions (Addendum)
Thank you for allowing [place] to provide your care today. Today we discussed your high cholesterol    I have ordered no labs for you.    Today we made no changes to your medications.    Please follow-up in 6 months.    Should you have any questions or concerns please call the internal medicine clinic at [phone].     Plan de alimentacin restringido en grasas y colesterol Fat and Cholesterol Restricted Eating Plan El exceso de grasas y colesterol en la dieta puede causar problemas de [place]. Elegir los alimentos adecuados ayuda a [employer] niveles de grasas y [place]. Esto puede evitarle contraer ciertas enfermedades. El mdico puede recomendarle un plan de alimentacin que incluya lo siguiente:  Grasas totales: ______% o menos del total de caloras por da.  Grasas saturadas: ______% o menos del total de caloras por da.  Colesterol: menos de _________mg Darlin Coco.  Fibra: ______g Darlin Coco. Cules son algunos consejos para seguir este plan? Planificacin de las comidas  En las comidas, [place] su plato en cuatro partes iguales: ? Llene la mitad del plato con verduras y ensaladas de hojas verdes. ? Llene un cuarto del plato con cereales integrales. ? Llene un cuarto del plato con alimentos con protenas con bajo contenido de grasas [employer]).  Coma pescado con alto contenido de grasas omega3 al [employer] veces por semana. Esas grasas se encuentran en la caballa, el atn, las sardinas y el salmn.  Coma alimentos con alto contenido de [place], como cereales [place], frijoles, [place], [place], [place], guisantes y [place]. Consejos generales   Si necesita adelgazar, consulte a su mdico.  Evite lo siguiente: ? Alimentos con Holiday representative. ? Comidas fritas. ? Alimentos con aceites parcialmente hidrogenados.  Limite el consumo de alcohol a no ms de 40medida por da si es mujer y no est [place], y 48medidas por da si es hombre. Una medida equivale a  12oz de Chartered certified accountant, 5oz de vino o 1oz de bebidas alcohlicas de alta graduacin. Lea las etiquetas de los alimentos  Lea las etiquetas de los alimentos para conocer lo siguiente: ? Si contienen grasas trans. ? Si contienen aceites parcialmente hidrogenados. ? La cantidad de grasas saturadas (g) que contiene cada porcin. ? La cantidad de colesterol (mg) que contiene cada porcin. ? La cantidad de fibra (g) que contiene cada porcin.  Elija alimentos con grasas saludables, tales como las siguientes: ? Grasas monoinsaturadas. ? Grasas poliinsaturadas. ? Grasas omega3.  Elija productos de cereal que tengan cereales integrales. Busque la palabra "integral" en Equities trader de la lista de ingredientes. Al cocinar  Emplee mtodos de coccin con poca cantidad de grasa. Por ejemplo, hornear, hervir, grillar y Holiday representative.  Coma ms comidas caseras. Coma en restaurantes y bares con menos frecuencia.  Evite cocinar usando grasas saturadas, como [place], crema, aceite de [place], aceite de palmiste y aceite de [place]. [place] recomendados  Frutas  Frutas frescas, en conserva (en su jugo natural) o frutas congeladas. Verduras  Verduras frescas o congeladas (crudas, al vapor, asadas o grilladas). Ensaladas de hojas verdes. Granos  Cereales integrales, como panes, galletas, cereales y pastas de integrales o de trigo integral. Avena sin endulzar, trigo bulgur, cebada, quinua o arroz integral. Tortillas de harina de maz o trigo integral. Carnes y otros alimentos ricos en protenas  Carne de res molida (al 85% o ms magra), carne de res de animales alimentados con pastos o carne de res sin la grasa. Pollo o pavo sin piel.  Carne de pollo o de [place]. Cerdo sin la grasa. Todos los pescados y frutos de mar. Claras de huevo. Porotos, guisantes o lentejas secos. Frutos secos o semillas sin sal. Frijoles enlatados sin sal. Mantequillas de frutos secos sin azcar ni aceite  agregados. Lcteos  Productos lcteos descremados o semidescremados, como [employer] o al 1%, quesos reducidos en grasas o al 2%, queso cottage o ricota con bajo contenido de [place] o sin contenido de [place], o yogur natural descremado o semidescremado. Grasas y aceites  Margarina untable que no contenga grasas trans. [place] y condimentos para ensaladas livianos o reducidos en grasas. [place]. Aceites de oliva, canola, ssamo o crtamo. Es posible que los productos que se enumeran ms [place] no constituyan una lista completa de los alimentos y las bebidas que puede tomar. Consulte a un nutricionista para obtener ms informacin. Alimentos que deben evitarse Frutas  Fruta enlatada en almbar espeso. Frutas con salsa de crema o mantequilla. Frutas cocidas en aceite. Verduras  Verduras cocinadas con salsas de queso, crema o mantequilla. Verduras fritas. Granos  Pan blanco. Pastas blancas. Arroz blanco. Pan de maz. Bagels, pasteles y croissants. Galletas saladas y colaciones que contengan grasas trans y aceites hidrogenados. Carnes y otros alimentos ricos en protenas  Cortes de carne con alto contenido de [place]. Costillas, alas de pollo, tocineta, salchicha, mortadela, salame, chinchulines, tocino, perros calientes, salchichas alemanas y embutidos envasados. Hgado y otros rganos. Huevos enteros y yemas de [place]. Pollo y pavo con piel. Carne frita. Lcteos  Leche entera o al 2%, crema, mezcla de [place] y crema, y queso crema. Quesos enteros. Yogur entero o endulzado. Quesos con toda su grasa. Cremas no lcteas y coberturas batidas. Quesos procesados, quesos para untar y [place]. Bebidas  Alcohol. Bebidas endulzadas con azcar, como refrescos, limonada y bebidas frutales. Grasas y aceites  [place], [place] en barra, Granite de [place], [place], [place] clarificada o grasa de tocino. Aceites de coco, de palmiste y de palma. Dulces y postres  Jarabe de maz, azcares,  miel y Control and instrumentation engineer. Caramelos. Mermeladas y [place]. Chrissie Noa. Cereales endulzados. Galletas, pasteles, bizcochuelos, donas, muffins y helado. Es posible que los productos que se enumeran ms [place] no constituyan una lista completa de los alimentos y las bebidas que Nurse, adult. Consulte a un nutricionista para obtener ms informacin. Resumen  Elegir los alimentos adecuados ayuda a Theatre manager normales los niveles de grasas y [place]. Esto puede evitarle contraer ciertas enfermedades.  En las comidas, llene la mitad del plato con verduras y ensaladas de hojas verdes.  Coma alimentos con alto contenido de [place], como cereales [place], frijoles, [place], zanahorias, guisantes y [place].  Limite los alimentos con azcar agregada y grasas saturadas, el alcohol y las comidas fritas. Esta informacin no tiene Marine scientist el consejo del mdico. Asegrese de hacerle al mdico cualquier pregunta que tenga. Document Revised: 04/27/2018 Document Reviewed: 08/11/2017 Elsevier Patient Education  2020 [employer].

## 2020-01-25 NOTE — Progress Notes (Signed)
Internal Medicine Clinic Attending ° °Case discussed with Dr. Lee at the time of the visit.  We reviewed the resident’s history and exam and pertinent patient test results.  I agree with the assessment, diagnosis, and plan of care documented in the resident’s note.  °

## 2020-09-07 DIAGNOSIS — M5416 Radiculopathy, lumbar region: Secondary | ICD-10-CM

## 2020-09-07 HISTORY — DX: Radiculopathy, lumbar region: M54.16

## 2020-10-01 ENCOUNTER — Other Ambulatory Visit: Payer: Self-pay

## 2020-10-01 ENCOUNTER — Telehealth: Payer: Self-pay

## 2020-10-01 DIAGNOSIS — Z1231 Encounter for screening mammogram for malignant neoplasm of breast: Secondary | ICD-10-CM

## 2020-10-01 NOTE — Telephone Encounter (Signed)
Health Coaching 3  interpreter- Tricia Lucas, [facility]   Goals- Patient has been walking or running for 40 minutes, 6 days a week. Patient has also eliminated fried foods from diet.   New goal- Decrease the amount of carbs consumed as well as watching the amount of foods consumed through portion control and food measuring.  Barrier to reaching goal-  Patient enjoys eating carbs such as bread, tortillas, pasta and potatoes.   Strategies to overcome- Explained to patient about the daily recommendation for the grain/carb food group. Explained that the recommendation is for 6 ounces or less per day. Explained what 1 ounce would look like for different types of grains/carbs that she enjoys eating. Instead of eliminating these foods completely from diet patient will start measuring portions using measuring cups.    Navigation:  Patient is aware of  a follow up session. Patient is scheduled for follow-up appointment on October 28, 2020 @ 10:00 am.   Time- 17 minutes

## 2020-10-28 ENCOUNTER — Other Ambulatory Visit: Payer: Self-pay

## 2020-10-28 ENCOUNTER — Inpatient Hospital Stay: Payer: Self-pay | Attending: Obstetrics and Gynecology | Admitting: *Deleted

## 2020-10-28 VITALS — BP 118/84 | Ht 65.0 in | Wt 161.7 lb

## 2020-10-28 DIAGNOSIS — Z0000 Encounter for general adult medical examination without abnormal findings: Secondary | ICD-10-CM

## 2020-10-28 NOTE — Progress Notes (Signed)
[facility] follow up   Interpreter: Rudene Anda, Erling Cruz  Clinical Measurement:   Vitals:   10/28/20 1009  BP: 118/84      Medical History:  Patient states that she has high cholesterol, does not have high blood pressure and she does not have diabetes.  Medications:  Patient states that she does not take medication to lower cholesterol, blood pressure or blood sugar.  Patient does not take an aspirin a day to help prevent a heart attack or stroke.   Blood pressure, self measurement: Patient states that she does not measure blood pressure from home. She checks her blood pressure N/A. She shares her readings with a health care provider: N/A.   Nutrition: Patient states that on average she eats 1 cups of fruit and 0 cups of vegetables per day. Patient states that she does not eat fish at least 2 times per week. Patient eats less than half servings of whole grains. Patient drinks less than 36 ounces of beverages with added sugar weekly: yes. Patient is currently watching sodium or salt intake: yes. In the past 7 days patient has had 0 drinks containing alcohol. On average patient drinks 0 drinks containing alcohol per day.      Physical activity:  Patient states that she gets 340 minutes of moderate and 0 minutes of vigorous physical activity each week.  Smoking status:  Patient states that she has has never smoked .   Quality of life:  Over the past 2 weeks patient states that she had little interest or pleasure in doing things: not at all. She has been feeling down, depressed or hopeless:not at all.    Risk reduction and counseling:   Health Coaching: Spoke with patient about daily recommendations of the different food groups. Gave patinet resource brochure "Mi Plato". Encouraged patient to continue watching the amount of fried and fatty foods she consumes given elevated cholesterol and LDL levels during initial screening. Encouraged patient to try to grill, bake, broil or sautee foods  instead.    Navigation: This was the  follow up session for this patient, I will check up on her progress in the coming months. Patient is scheduled for annual re-screening on Jan 20, 2021.   Time: 25 minutes

## 2020-12-12 ENCOUNTER — Emergency Department (HOSPITAL_BASED_OUTPATIENT_CLINIC_OR_DEPARTMENT_OTHER)
Admission: EM | Admit: 2020-12-12 | Discharge: 2020-12-12 | Disposition: A | Discharge status: Home / Self Care | Payer: 59 | Attending: Emergency Medicine | Admitting: Emergency Medicine

## 2020-12-12 ENCOUNTER — Emergency Department (HOSPITAL_BASED_OUTPATIENT_CLINIC_OR_DEPARTMENT_OTHER): Payer: 59

## 2020-12-12 ENCOUNTER — Other Ambulatory Visit: Payer: Self-pay

## 2020-12-12 ENCOUNTER — Encounter (HOSPITAL_BASED_OUTPATIENT_CLINIC_OR_DEPARTMENT_OTHER): Payer: Self-pay

## 2020-12-12 DIAGNOSIS — M549 Dorsalgia, unspecified: Secondary | ICD-10-CM | POA: Diagnosis present

## 2020-12-12 DIAGNOSIS — M5442 Lumbago with sciatica, left side: Secondary | ICD-10-CM | POA: Insufficient documentation

## 2020-12-12 DIAGNOSIS — M5441 Lumbago with sciatica, right side: Secondary | ICD-10-CM | POA: Diagnosis not present

## 2020-12-12 DIAGNOSIS — R202 Paresthesia of skin: Secondary | ICD-10-CM | POA: Diagnosis not present

## 2020-12-12 MED ORDER — KETOROLAC TROMETHAMINE 10 MG PO TABS: 10.0000 mg | ORAL_TABLET | Freq: Four times a day (QID) | ORAL | 0 refills | Status: DC | PRN

## 2020-12-12 MED ORDER — KETOROLAC TROMETHAMINE 30 MG/ML IJ SOLN
60.0000 mg | Freq: Once | INTRAMUSCULAR | Status: AC
  Administered 2020-12-12: 60 mg via INTRAMUSCULAR
  Filled 2020-12-12: qty 2

## 2020-12-12 NOTE — ED Triage Notes (Addendum)
Pt c/o lower back pain that radiates down bilat LE/numbess to bilat LE started this am-denies injury-to triage in w/c-PA in triage for MSE-onsite spanish interpreter used

## 2020-12-12 NOTE — ED Provider Notes (Signed)
MSE was initiated and I personally evaluated the patient and placed orders (if any) at  4:13 PM on December 12, 2020.  The patient appears stable so that the remainder of the MSE may be completed by another provider.  Patient placed in Quick Look pathway, seen and evaluated   Chief Complaint: back pain, LE numbness  HPI:   50 y.o. F who presents for evaluation of lower back pain that began yesterday when she woke up. She reports that pain radiates to her legs and she has had some "heaviness" to her BLE. No bowel/bladder incontinences.   ROS: Back pain (one)  Physical Exam:   Gen: No distress  Neuro: Awake and Alert  Skin: Warm    Focused Exam: tenderness to palpation noted to lower back. No deformity or crepitus noted.    4:13 PM: Notified charge RN that patient needed to be brought back to the pain ED.    Initiation of care has begun. The patient has been counseled on the process, plan, and necessity for staying for the completion/evaluation, and the remainder of the medical screening examination    Desma Mcgregor 12/12/20 1614    Arnaldo Natal, MD 12/12/20 [phone]

## 2020-12-12 NOTE — ED Provider Notes (Signed)
[facility] EMERGENCY DEPARTMENT Provider Note   CSN: [id number] Arrival date & time: 12/12/20  1556     History Chief Complaint  Patient presents with  . Back Pain    Tricia Lucas is a 50 y.o. female.  The history is provided by the patient. A language interpreter was used (family member; Patient was offered video interpreter).  Back Pain Pain location: coccyx. Quality:  Burning Radiates to:  L posterior upper leg, R posterior upper leg, L thigh, R thigh, L knee, R knee, L foot and R foot Pain severity:  Severe Pain is:  Same all the time Onset quality:  Sudden Duration:  1 day Timing:  Constant Progression:  Unchanged Chronicity:  New Context: not falling, not occupational injury, not recent illness and not recent injury   Context comment:  She does clean houses for a living. Relieved by:  Being still Worsened by:  Movement, bending and twisting Ineffective treatments:  OTC medications Associated symptoms: paresthesias   Associated symptoms: no abdominal pain, no bladder incontinence, no bowel incontinence, no chest pain, no dysuria, no fever and no weakness   Associated symptoms comment:  States the front of both legs on the bottom of her feet are numb Risk factors: no hx of cancer        Past Medical History:  Diagnosis Date  . Depression   . Headache     Patient Active Problem List   Diagnosis Date Noted  . HLD (hyperlipidemia) 01/24/2020  . Endometriosis 01/24/2020    Past Surgical History:  Procedure Laterality Date  . ABLATION    . CESAREAN SECTION    . CHOLECYSTECTOMY  2005     OB History    Gravida  2   Para      Term      Preterm      AB      Living  2     SAB      IAB      Ectopic      Multiple      Live Births              Family History  Problem Relation Age of Onset  . Diabetes Paternal Grandfather   . Diabetes Paternal Grandmother   . Diabetes Maternal Grandmother   . Diabetes Maternal Grandfather    . Diabetes Mother   . Diabetes Father   . Diabetes Sister   . Diabetes Brother   . Diabetes Sister   . Diabetes Sister   . Diabetes Sister   . Diabetes Brother     Social History   Tobacco Use  . Smoking status: Never Smoker  . Smokeless tobacco: Never Used  Vaping Use  . Vaping Use: Never used  Substance Use Topics  . Alcohol use: No  . Drug use: No    Home Medications Prior to Admission medications   Medication Sig Start Date End Date Taking? Authorizing Provider  aspirin-acetaminophen-caffeine (EXCEDRIN MIGRAINE) [phone] MG tablet Take 1 tablet by mouth every 6 (six) hours as needed for headache.    [provider]  buPROPion (ZYBAN) 150 MG 12 hr tablet Take 150 mg by mouth 2 (two) times daily. Patient not taking: Reported on 10/28/2020    [provider]  ibuprofen (ADVIL,MOTRIN) 800 MG tablet Take 1 tablet (800 mg total) by mouth 3 (three) times daily. Patient not taking: Reported on 10/28/2020 05/03/14   Fransico Meadow, PA-C  Topiramate ER (TROKENDI XR) 50 MG  CP24 Take by mouth. Patient not taking: Reported on 10/28/2020    [provider]    Allergies    Patient has no known allergies.  Review of Systems   Review of Systems  Constitutional: Negative for chills and fever.  HENT: Negative for ear pain and sore throat.   Eyes: Negative for pain and visual disturbance.  Respiratory: Negative for cough and shortness of breath.   Cardiovascular: Negative for chest pain and palpitations.  Gastrointestinal: Negative for abdominal pain, bowel incontinence and vomiting.  Genitourinary: Negative for bladder incontinence, dysuria and hematuria.  Musculoskeletal: Positive for back pain. Negative for arthralgias.  Skin: Negative for color change and rash.  Neurological: Positive for paresthesias. Negative for seizures, syncope and weakness.  All other systems reviewed and are negative.   Physical Exam Updated Vital Signs BP (!) 148/93    Pulse 79   Temp 98.2 F (36.8 C) (Oral)   Resp 20   Ht 5\' 5"  (1.651 m)   Wt 69.9 kg   SpO2 100%   BMI 25.63 kg/m   Physical Exam Vitals and nursing note reviewed.  HENT:     Head: Normocephalic and atraumatic.  Eyes:     General: No scleral icterus. Pulmonary:     Effort: Pulmonary effort is normal. No respiratory distress.  Musculoskeletal:     Cervical back: Normal range of motion.     Lumbar back: Decreased range of motion. Positive right straight leg raise test and positive left straight leg raise test.       Back:     Comments: Tender about the midline of the low lumbar spine and both SI joints  Skin:    General: Skin is warm and dry.  Neurological:     Mental Status: She is alert.     Motor: No weakness.     Gait: Gait is intact.     Deep Tendon Reflexes:     Reflex Scores:      Patellar reflexes are 2+ on the right side and 2+ on the left side.      Achilles reflexes are 2+ on the right side and 2+ on the left side. Psychiatric:        Mood and Affect: Mood normal.     ED Results / Procedures / Treatments   Labs (all labs ordered are listed, but only abnormal results are displayed) Labs Reviewed - No data to display  EKG None  Radiology DG Lumbar Spine Complete  Result Date: 12/12/2020 CLINICAL DATA:  50 year old female with radicular pain. EXAM: LUMBAR SPINE - COMPLETE 4+ VIEW COMPARISON:  None. FINDINGS: Five lumbar type vertebra. There is no acute fracture or subluxation of the lumbar spine. The vertebral body heights maintained. The visualized posterior elements are intact. The soft tissues are unremarkable. Right upper quadrant cholecystectomy clips. IMPRESSION: Negative. Electronically Signed   By: Anner Crete M.D.   On: 12/12/2020 17:45    Procedures Procedures   Medications Ordered in ED Medications  ketorolac (TORADOL) 30 MG/ML injection 60 mg (has no administration in time range)    ED Course  I have reviewed the triage vital signs  and the nursing notes.  Pertinent labs & imaging results that were available during my care of the patient were reviewed by me and considered in my medical decision making (see chart for details).    MDM Rules/Calculators/A&P  Velvet Moomaw presents with back pain and bilateral paresthesias.  She is neurologically intact without evidence of cauda equina or cord compression.  X-ray was negative for infiltrative lesions or significant degenerative disease.  She was treated symptomatically and advised to start rehab exercises at home and treat her symptoms with anti-inflammatories.  She was given return precautions as well as instructions for follow-up should her symptoms persist. Final Clinical Impression(s) / ED Diagnoses Final diagnoses:  Acute bilateral low back pain with bilateral sciatica    Rx / DC Orders ED Discharge Orders         Ordered    ketorolac (TORADOL) 10 MG tablet  Every 6 hours PRN        12/12/20 1811           Arnaldo Natal, MD 12/12/20 [phone]

## 2020-12-12 NOTE — ED Notes (Signed)
PA advised pt to tx room-pt to tx room via w/c by EMT

## 2020-12-18 ENCOUNTER — Other Ambulatory Visit: Payer: Self-pay

## 2020-12-18 ENCOUNTER — Ambulatory Visit (INDEPENDENT_AMBULATORY_CARE_PROVIDER_SITE_OTHER): Payer: 59 | Admitting: Family Medicine

## 2020-12-18 ENCOUNTER — Encounter: Payer: Self-pay | Admitting: Family Medicine

## 2020-12-18 VITALS — BP 120/82 | Ht 65.0 in | Wt 154.0 lb

## 2020-12-18 DIAGNOSIS — M5416 Radiculopathy, lumbar region: Secondary | ICD-10-CM | POA: Insufficient documentation

## 2020-12-18 DIAGNOSIS — M4807 Spinal stenosis, lumbosacral region: Secondary | ICD-10-CM

## 2020-12-18 DIAGNOSIS — M48062 Spinal stenosis, lumbar region with neurogenic claudication: Secondary | ICD-10-CM | POA: Insufficient documentation

## 2020-12-18 NOTE — Assessment & Plan Note (Signed)
Symptoms seem more associated with the spinal stenosis as opposed to the facet changes as she does not have pain on extension.  Has tried several medications with limited improvement.  Has been ongoing for a few months. -Counseled on home exercise therapy and supportive care. -MRI to evaluate for spinal spinal stenosis. -Epidural -Referral to physical therapy.

## 2020-12-18 NOTE — Progress Notes (Signed)
  [name] - 50 y.o. female MRN [id number]  Date of birth: 1971/01/25  SUBJECTIVE:  Including CC & ROS.  No chief complaint on file.   Tricia Lucas is a 50 y.o. female that is presenting with bilateral lower leg pain.  She has tried different medications such as Toradol, gabapentin and Vimovo.  She is also been on prednisone.  Symptoms have gotten worse over the past few weeks.  There intermittent over the past months.  Denies any injury or inciting event.  Independent review of the lumbar spine x-ray from 4/7 shows no changes. Review of the CT lumbar spine from 02/22/2020 shows spinal stenosis with facet degenerative change in lumbar spine.  Review of Systems See HPI   HISTORY: Past Medical, Surgical, Social, and Family History Reviewed & Updated per EMR.   Pertinent Historical Findings include:  Past Medical History:  Diagnosis Date  . Depression   . Headache     Past Surgical History:  Procedure Laterality Date  . ABLATION    . CESAREAN SECTION    . CHOLECYSTECTOMY  2005    Family History  Problem Relation Age of Onset  . Diabetes Paternal Grandfather   . Diabetes Paternal Grandmother   . Diabetes Maternal Grandmother   . Diabetes Maternal Grandfather   . Diabetes Mother   . Diabetes Father   . Diabetes Sister   . Diabetes Brother   . Diabetes Sister   . Diabetes Sister   . Diabetes Sister   . Diabetes Brother     Social History   Socioeconomic History  . Marital status: Married    Spouse name: Not on file  . Number of children: Not on file  . Years of education: Not on file  . Highest education level: 12th grade  Occupational History  . Not on file  Tobacco Use  . Smoking status: Never Smoker  . Smokeless tobacco: Never Used  Vaping Use  . Vaping Use: Never used  Substance and Sexual Activity  . Alcohol use: No  . Drug use: No  . Sexual activity: Not on file  Other Topics Concern  . Not on file  Social History Narrative  . Not on file    Social Determinants of Health   Financial Resource Strain: Not on file  Food Insecurity: Not on file  Transportation Needs: No Transportation Needs  . Lack of Transportation (Medical): No  . Lack of Transportation (Non-Medical): No  Physical Activity: Not on file  Stress: Not on file  Social Connections: Not on file  Intimate Partner Violence: Not on file     PHYSICAL EXAM:  VS: BP 120/82 (BP Location: Left Arm, Patient Position: Sitting, Cuff Size: Large)   Ht 5\' 5"  (1.651 m)   Wt 154 lb (69.9 kg)   BMI 25.63 kg/m  Physical Exam Gen: NAD, alert, cooperative with exam, well-appearing MSK:  Back: Normal flexion and extension. Instability with 1 leg standing more on the right than the left. Negative straight leg raise. Neurovascular intact     ASSESSMENT & PLAN:   Spinal stenosis of lumbar region with neurogenic claudication Symptoms seem more associated with the spinal stenosis as opposed to the facet changes as she does not have pain on extension.  Has tried several medications with limited improvement.  Has been ongoing for a few months. -Counseled on home exercise therapy and supportive care. -MRI to evaluate for spinal spinal stenosis. -Epidural -Referral to physical therapy.

## 2020-12-18 NOTE — Patient Instructions (Addendum)
Nice to meet you Please try physical therapy   Please call [facility] @ [facility] [address], [place], [place] [address]   Please send me a message in [place] with any questions or updates.  Please see me back in 4 weeks.   --Dr. Melbourne Abts placer conocerte Por favor, prueba la fisioterapia. Por favor llame al [phone] Enveme un mensaje en MyChart con cualquier pregunta o actualizacin. Por favor, vuelve a verme en 4 semanas.

## 2020-12-28 ENCOUNTER — Other Ambulatory Visit: Payer: Self-pay

## 2020-12-28 ENCOUNTER — Ambulatory Visit (INDEPENDENT_AMBULATORY_CARE_PROVIDER_SITE_OTHER): Payer: 59

## 2020-12-28 DIAGNOSIS — M79605 Pain in left leg: Secondary | ICD-10-CM | POA: Diagnosis not present

## 2020-12-28 DIAGNOSIS — M4807 Spinal stenosis, lumbosacral region: Secondary | ICD-10-CM

## 2020-12-28 DIAGNOSIS — M79604 Pain in right leg: Secondary | ICD-10-CM | POA: Diagnosis not present

## 2020-12-31 ENCOUNTER — Encounter: Payer: Self-pay | Admitting: Physical Therapy

## 2020-12-31 ENCOUNTER — Ambulatory Visit: Payer: 59 | Attending: Family Medicine | Admitting: Physical Therapy

## 2020-12-31 ENCOUNTER — Other Ambulatory Visit: Payer: Self-pay

## 2020-12-31 DIAGNOSIS — M5441 Lumbago with sciatica, right side: Secondary | ICD-10-CM | POA: Diagnosis present

## 2020-12-31 DIAGNOSIS — M5442 Lumbago with sciatica, left side: Secondary | ICD-10-CM | POA: Insufficient documentation

## 2020-12-31 DIAGNOSIS — M62838 Other muscle spasm: Secondary | ICD-10-CM | POA: Diagnosis present

## 2020-12-31 DIAGNOSIS — R29898 Other symptoms and signs involving the musculoskeletal system: Secondary | ICD-10-CM | POA: Insufficient documentation

## 2020-12-31 DIAGNOSIS — R262 Difficulty in walking, not elsewhere classified: Secondary | ICD-10-CM | POA: Insufficient documentation

## 2020-12-31 NOTE — Therapy (Signed)
[facility] [address]  [place], [place], [address] Phone: [phone]   Fax:  [phone]  Physical Therapy Evaluation  Patient Details  Name: Tricia Lucas MRN: [id number] Date of Birth: Jul 06, 1971 Referring Provider (PT): Clearance Coots, MD   Encounter Date: 12/31/2020   PT End of Session - 12/31/20 1545    Visit Number 1    Number of Visits 13    Date for PT Re-Evaluation 02/11/21    Authorization Type [employer]    Authorization - Number of Visits 30    PT Start Time 7253    PT Stop Time 1535    PT Time Calculation (min) 46 min    Activity Tolerance Patient tolerated treatment well;Patient limited by pain    Behavior During Therapy [facility] for tasks assessed/performed           Past Medical History:  Diagnosis Date  . Depression   . Headache     Past Surgical History:  Procedure Laterality Date  . ABLATION    . CESAREAN SECTION    . CHOLECYSTECTOMY  2005    There were no vitals filed for this visit.    Subjective Assessment - 12/31/20 1452    Subjective Patient reports LBP for the past several years with recent worsening in the past 3 weeks.  Pain is located over the end of the tailbone with radiation to B sides. Reports radiation of sharp pain in the R upper buttock to the R ankle. Worse with prolonged sitting as she frequently has to change positions, prolonged walking/standing, bending forward. Slightly better with hot compress, ibuprofen, icy hot. Notes N/T in the R>L LE down to the plantar foot. Denies changes in B&B control.    Patient is accompained by: Interpreter    Pertinent History HA, depression    Limitations Lifting;Standing;Walking;House hold activities    How long can you stand comfortably? around 4 hours of work    Diagnostic tests 12/12/20 lumbar xray: negative    Patient Stated Goals decrease pain    Currently in Pain? Yes    Pain Score 4     Pain Location Back    Pain  Orientation Right;Left;Lower    Pain Type Acute pain;Chronic pain    Pain Radiating Towards R LE to ankle              [facility] PT Assessment - 12/31/20 1500      Assessment   Medical Diagnosis Spinal stenosis of lumbar region with neurogenic claudication    Referring Provider (PT) Clearance Coots, MD    Onset Date/Surgical Date 12/10/20    Next MD Visit 01/02/21    Prior Therapy no      Precautions   Precautions None      Balance Screen   Has the patient fallen in the past 6 months No    Has the patient had a decrease in activity level because of a fear of falling?  No    Is the patient reluctant to leave their home because of a fear of falling?  No      Home Environment   Living Environment Private residence    Living Arrangements Spouse/significant other    Available Help at Discharge Family    Type of [place] Access Level entry    [place] Two level    Alternate Level Stairs-Number of Steps 17    Alternate Level Stairs-Rails Right  Prior Function   Level of Independence Independent    Vocation Full time employment    Vocation Requirements cleans houses- standing, walking, bending over, lifting vacuum up/down stairs    Leisure gym      Cognition   Overall Cognitive Status Within Functional Limits for tasks assessed      Sensation   Light Touch Appears Intact      Coordination   Gross Motor Movements are Fluid and Coordinated Yes      Posture/Postural Control   Posture/Postural Control No significant limitations    Posture Comments sitting sideways in chair      ROM / Strength   AROM / PROM / Strength AROM;Strength      Strength   Strength Assessment Site Hip;Knee;Ankle    Right/Left Hip Right;Left    Right Hip Flexion 4+/5    Right Hip ABduction 4+/5    Right Hip ADduction 4/5    Left Hip Flexion 4/5    Left Hip ABduction 4/5    Left Hip ADduction 4/5    Right/Left Knee Right;Left    Right Knee Flexion 4/5   pain in R buttock    Right Knee Extension 4/5   pain in groin   Left Knee Flexion 4+/5    Left Knee Extension 4-/5    Right/Left Ankle Right;Left    Right Ankle Dorsiflexion 4+/5    Right Ankle Plantar Flexion 4+/5    Left Ankle Dorsiflexion 4/5    Left Ankle Plantar Flexion 4+/5      Palpation   Palpation comment very tight and tender throughout thoracic and lumbar musculature as well as proximal glutes, lateral glutes, piriformis, and R>L R hip flexors and adductors      Ambulation/Gait   Assistive device None    Gait Pattern Step-through pattern;Trunk flexed    Ambulation Surface Level;Indoor    Gait velocity slightly decreased                      Objective measurements completed on examination: See above findings.               PT Education - 12/31/20 1544    Education Details prognosis, POC, HEP; edu on using ice/heat for pain relief and to avoid starting new fitness program until pain is better-controlled    Person(s) Educated Patient    Methods Explanation;Demonstration;Tactile cues;Verbal cues;Handout    Comprehension Verbalized understanding;Returned demonstration            PT Short Term Goals - 12/31/20 1553      PT SHORT TERM GOAL #1   Title Patient to be independent with initial HEP.    Time 3    Period Weeks    Status New    Target Date 01/21/21             PT Long Term Goals - 12/31/20 1553      PT LONG TERM GOAL #1   Title Patient to be independent with advanced HEP.    Time 6    Period Weeks    Status New    Target Date 02/11/21      PT LONG TERM GOAL #2   Title Patient to demonstrate B LE strength >/=4+/5.    Time 6    Period Weeks    Status New    Target Date 02/11/21      PT LONG TERM GOAL #3   Title Patient to demonstrate lumbar AROM WFL and without  pain limiting.    Time 6    Period Weeks    Status New    Target Date 02/11/21      PT LONG TERM GOAL #4   Title Patient to report tolerance for a full work shift with <3/10  pain.    Time 6    Period Weeks    Status New    Target Date 02/11/21      PT LONG TERM GOAL #5   Title Patient to demonstrate 75% improvement in tolerance for papation and tone of LB and hip musculature.    Time 6    Period Weeks    Status New    Target Date 02/11/21                  Plan - 12/31/20 1545    Clinical Impression Statement Patient is a 50 y/o F presenting to OPPT with c/o acute on chronic B LBP radiating to B LEs for the past 3 weeks. Pain is located in the B LB with radiation down the R superior buttock to the R ankle. Worse with prolonged sitting, walking, or standing and when bending forward. Also reports N/T in the R>L LE down to the plantar aspect of the foot. Patient today presenting with B LE weakness, considerably tight and tender throughout thoracic and lumbar musculature as well as proximal glutes, lateral glutes, piriformis, and R>L R hip flexors and adductors, and gait deviations. Assessment today limited d/t increased time taken for interpreting. Patient was educated on gentle stretching and AROM HEP as well as use of ice/heat to address pain and to avoid starting new fitness program until pain is more under control- patient reported understanding. Would benefit from skilled PT services 2x/week for 6 weeks to address aforementioned impairments.    Personal Factors and Comorbidities Age;Comorbidity 2;Past/Current Experience;Profession;Time since onset of injury/illness/exacerbation    Comorbidities HA, depression    Examination-Activity Limitations Sleep;Sit;Squat;Bend;Stairs;Stand;Carry;Transfers;Dressing;Hygiene/Grooming;Lift;Locomotion Level;Reach Overhead    Examination-Participation Restrictions Church;Cleaning;Community Activity;Laundry;Meal Prep;Occupation;Yard Work;Shop    Stability/Clinical Decision Making Evolving/Moderate complexity    Clinical Decision Making Moderate    Rehab Potential Good    PT Frequency 2x / week    PT Duration 6 weeks     PT Treatment/Interventions ADLs/Self Care Home Management;Cryotherapy;Electrical Stimulation;Moist Heat;Traction;Balance training;Therapeutic exercise;Therapeutic activities;Functional mobility training;Stair training;Gait training;Ultrasound;Neuromuscular re-education;Patient/family education;Manual techniques;Taping;Energy conservation;Dry needling;Passive range of motion    PT Next Visit Plan assess lumbar AROM; reassess HEP; progress lumbopelvic ROM and hip stretching to tolerance; STM and modalities as needed    Consulted and Agree with Plan of Care Patient           Patient will benefit from skilled therapeutic intervention in order to improve the following deficits and impairments:  Decreased activity tolerance,Decreased strength,Increased fascial restricitons,Pain,Difficulty walking,Increased muscle spasms,Improper body mechanics,Decreased range of motion,Impaired flexibility,Postural dysfunction  Visit Diagnosis: Acute bilateral low back pain with bilateral sciatica  Other muscle spasm  Other symptoms and signs involving the musculoskeletal system  Difficulty in walking, not elsewhere classified     Problem List Patient Active Problem List   Diagnosis Date Noted  . Spinal stenosis of lumbar region with neurogenic claudication 12/18/2020  . HLD (hyperlipidemia) 01/24/2020  . Endometriosis 01/24/2020      Janene Harvey, PT, DPT 12/31/20 4:01 PM   [facility] [address]  [place], [place], [address] Phone: [phone]   Fax:  [phone]  Name: Tricia Lucas MRN: [id number] Date of Birth:  08/13/1971  

## 2021-01-02 ENCOUNTER — Ambulatory Visit (INDEPENDENT_AMBULATORY_CARE_PROVIDER_SITE_OTHER): Payer: 59 | Admitting: Family Medicine

## 2021-01-02 ENCOUNTER — Other Ambulatory Visit: Payer: Self-pay

## 2021-01-02 ENCOUNTER — Ambulatory Visit: Payer: No Typology Code available for payment source

## 2021-01-02 ENCOUNTER — Encounter: Payer: Self-pay | Admitting: Family Medicine

## 2021-01-02 ENCOUNTER — Ambulatory Visit: Payer: 59 | Admitting: Family Medicine

## 2021-01-02 DIAGNOSIS — M5416 Radiculopathy, lumbar region: Secondary | ICD-10-CM | POA: Diagnosis not present

## 2021-01-02 MED ORDER — GABAPENTIN 100 MG PO CAPS: 100.0000 mg | ORAL_CAPSULE | Freq: Three times a day (TID) | ORAL | 1 refills | Status: DC

## 2021-01-02 NOTE — Patient Instructions (Signed)
Good to see you Please start with one gabapentin pill at night. You can increase to 2 pills after three days and up to three pills three times a day as you tolerate We will inform you of the location for the epidural   Please send me a message in MyChart with any questions or updates.  Please see me back 2 weeks after the epidural.   --Dr. Lorina Rabon bueno verte Comience con Tricia Lucas pastilla de gabapentina por la noche. Puede aumentar a 2 pastillas despus de tres das y [place] tres pastillas tres veces al da segn lo tolere. Le informaremos de la ubicacin de la epidural. Enveme un mensaje en MyChart con cualquier pregunta o actualizacin. Vuelva a verme 2 semanas despus de la epidural.

## 2021-01-02 NOTE — Progress Notes (Signed)
**Note Tricia-Identified via Obfuscation**   Tricia Lucas - 50 y.o. female MRN [id number]  Date of birth: Aug 20, 1971  SUBJECTIVE:  Including CC & ROS.  No chief complaint on file.   Tricia Lucas is a 50 y.o. female that is following up after the MRI of the lumbar spine.  She continues to have pain in her legs.  MRI of the lumbar spine was revealing for recess stenosis at L3-4 and a central disc protrusion at L4-5 and a moderate bilateral recess stenosis at L4-5.Marland Kitchen  Telephone Spanish interpreter used for this interview.   Review of Systems See HPI   HISTORY: Past Medical, Surgical, Social, and Family History Reviewed & Updated per EMR.   Pertinent Historical Findings include:  Past Medical History:  Diagnosis Date  . Depression   . Headache     Past Surgical History:  Procedure Laterality Date  . ABLATION    . CESAREAN SECTION    . CHOLECYSTECTOMY  2005    Family History  Problem Relation Age of Onset  . Diabetes Paternal Grandfather   . Diabetes Paternal Grandmother   . Diabetes Maternal Grandmother   . Diabetes Maternal Grandfather   . Diabetes Mother   . Diabetes Father   . Diabetes Sister   . Diabetes Brother   . Diabetes Sister   . Diabetes Sister   . Diabetes Sister   . Diabetes Brother     Social History   Socioeconomic History  . Marital status: Married    Spouse name: Not on file  . Number of children: Not on file  . Years of education: Not on file  . Highest education level: 12th grade  Occupational History  . Not on file  Tobacco Use  . Smoking status: Never Smoker  . Smokeless tobacco: Never Used  Vaping Use  . Vaping Use: Never used  Substance and Sexual Activity  . Alcohol use: No  . Drug use: No  . Sexual activity: Not on file  Other Topics Concern  . Not on file  Social History Narrative  . Not on file   Social Determinants of Health   Financial Resource Strain: Not on file  Food Insecurity: Not on file  Transportation Needs: No Transportation Needs  . Lack of  Transportation (Medical): No  . Lack of Transportation (Non-Medical): No  Physical Activity: Not on file  Stress: Not on file  Social Connections: Not on file  Intimate Partner Violence: Not on file     PHYSICAL EXAM:  VS: BP 118/78 (BP Location: Left Arm, Patient Position: Sitting, Cuff Size: Normal)   Ht 5\' 5"  (1.651 m)   Wt 154 lb (69.9 kg)   BMI 25.63 kg/m  Physical Exam Gen: NAD, alert, cooperative with exam, well-appearing       ASSESSMENT & PLAN:   Lumbar radiculopathy MRI showing different levels of stenosis and her symptoms are more radicular in nature. -Counseled on home exercise therapy and supportive care. -Epidural. -Gabapentin. -Could consider physical therapy.

## 2021-01-02 NOTE — Progress Notes (Deleted)
  Tricia Lucas - 50 y.o. female MRN [id number]  Date of birth: 03-Nov-1970  SUBJECTIVE:  Including CC & ROS.  No chief complaint on file.   Tricia Lucas is a 50 y.o. female that is  ***.  ***   Review of Systems See HPI   HISTORY: Past Medical, Surgical, Social, and Family History Reviewed & Updated per EMR.   Pertinent Historical Findings include:  Past Medical History:  Diagnosis Date  . Depression   . Headache     Past Surgical History:  Procedure Laterality Date  . ABLATION    . CESAREAN SECTION    . CHOLECYSTECTOMY  2005    Family History  Problem Relation Age of Onset  . Diabetes Paternal Grandfather   . Diabetes Paternal Grandmother   . Diabetes Maternal Grandmother   . Diabetes Maternal Grandfather   . Diabetes Mother   . Diabetes Father   . Diabetes Sister   . Diabetes Brother   . Diabetes Sister   . Diabetes Sister   . Diabetes Sister   . Diabetes Brother     Social History   Socioeconomic History  . Marital status: Married    Spouse name: Not on file  . Number of children: Not on file  . Years of education: Not on file  . Highest education level: 12th grade  Occupational History  . Not on file  Tobacco Use  . Smoking status: Never Smoker  . Smokeless tobacco: Never Used  Vaping Use  . Vaping Use: Never used  Substance and Sexual Activity  . Alcohol use: No  . Drug use: No  . Sexual activity: Not on file  Other Topics Concern  . Not on file  Social History Narrative  . Not on file   Social Determinants of Health   Financial Resource Strain: Not on file  Food Insecurity: Not on file  Transportation Needs: No Transportation Needs  . Lack of Transportation (Medical): No  . Lack of Transportation (Non-Medical): No  Physical Activity: Not on file  Stress: Not on file  Social Connections: Not on file  Intimate Partner Violence: Not on file     PHYSICAL EXAM:  VS: There were no vitals taken for this visit. Physical Exam Gen: NAD,  alert, cooperative with exam, well-appearing MSK:  ***      ASSESSMENT & PLAN:   No problem-specific Assessment & Plan notes found for this encounter.

## 2021-01-02 NOTE — Assessment & Plan Note (Addendum)
MRI showing different levels of stenosis and her symptoms are more radicular in nature. -Counseled on home exercise therapy and supportive care. -Epidural. -Gabapentin. -Could consider physical therapy.

## 2021-01-06 ENCOUNTER — Ambulatory Visit: Payer: 59 | Admitting: Physical Therapy

## 2021-01-08 ENCOUNTER — Other Ambulatory Visit: Payer: 59

## 2021-01-09 ENCOUNTER — Other Ambulatory Visit: Payer: Self-pay

## 2021-01-09 ENCOUNTER — Ambulatory Visit: Payer: 59 | Attending: Family Medicine

## 2021-01-09 DIAGNOSIS — R262 Difficulty in walking, not elsewhere classified: Secondary | ICD-10-CM | POA: Insufficient documentation

## 2021-01-09 DIAGNOSIS — M62838 Other muscle spasm: Secondary | ICD-10-CM | POA: Diagnosis present

## 2021-01-09 DIAGNOSIS — M5442 Lumbago with sciatica, left side: Secondary | ICD-10-CM | POA: Insufficient documentation

## 2021-01-09 DIAGNOSIS — M5441 Lumbago with sciatica, right side: Secondary | ICD-10-CM | POA: Insufficient documentation

## 2021-01-09 DIAGNOSIS — R29898 Other symptoms and signs involving the musculoskeletal system: Secondary | ICD-10-CM | POA: Diagnosis present

## 2021-01-09 NOTE — Therapy (Signed)
[facility] [address]  [place], [place], [address] Phone: [phone]   Fax:  [phone]  Physical Therapy Treatment  Patient Details  Name: Tricia Lucas MRN: [id number] Date of Birth: 02/01/1971 Referring Provider (PT): Clearance Coots, MD   Encounter Date: 01/09/2021   PT End of Session - 01/09/21 1709    Visit Number 2    Number of Visits 13    Date for PT Re-Evaluation 02/11/21    Authorization Type [employer]    PT Start Time 1617    PT Stop Time 1657    PT Time Calculation (min) 40 min    Activity Tolerance Patient tolerated treatment well;Patient limited by pain    Behavior During Therapy [facility] for tasks assessed/performed           Past Medical History:  Diagnosis Date  . Depression   . Headache     Past Surgical History:  Procedure Laterality Date  . ABLATION    . CESAREAN SECTION    . CHOLECYSTECTOMY  2005    There were no vitals filed for this visit.   Subjective Assessment - 01/09/21 1620    Subjective Pt reports that her exercises have been going ok. She is in a little less pain today.    Pertinent History HA, depression    Diagnostic tests 12/12/20 lumbar xray: negative    Patient Stated Goals decrease pain    Currently in Pain? Yes    Pain Score 4     Pain Location Back    Pain Orientation Right;Left;Lower    Pain Type Acute pain;Chronic pain              [facility] PT Assessment - 01/09/21 0001      AROM   AROM Assessment Site Lumbar    Lumbar Flexion hands to ankles    Lumbar Extension unable d/t pain    Lumbar - Right Side Bend down to mid thigh; pain    Lumbar - Left Side Bend down to mid thigh    Lumbar - Right Rotation 25% limited pain    Lumbar - Left Rotation 25% limited pain                         [facility] Adult PT Treatment/Exercise - 01/09/21 0001      Exercises   Exercises Lumbar      Lumbar Exercises: Stretches   Pelvic Tilt 10 reps     Pelvic Tilt Limitations seated      Lumbar Exercises: Aerobic   Nustep L1x53min      Lumbar Exercises: Seated   Sit to Stand 5 reps    Sit to Stand Limitations 2x5 reps    Other Seated Lumbar Exercises 3 way prayer stretch with orange pball 10x5"; R/L SB 5x5"                    PT Short Term Goals - 01/09/21 1710      PT SHORT TERM GOAL #1   Title Patient to be independent with initial HEP.    Time 3    Period Weeks    Status On-going    Target Date 01/21/21             PT Long Term Goals - 01/09/21 1710      PT LONG TERM GOAL #1   Title Patient to be independent with advanced HEP.  Time 6    Period Weeks    Status On-going      PT LONG TERM GOAL #2   Title Patient to demonstrate B LE strength >/=4+/5.    Time 6    Period Weeks    Status On-going      PT LONG TERM GOAL #3   Title Patient to demonstrate lumbar AROM WFL and without pain limiting.    Time 6    Period Weeks    Status On-going      PT LONG TERM GOAL #4   Title Patient to report tolerance for a full work shift with <3/10 pain.    Time 6    Period Weeks    Status On-going      PT LONG TERM GOAL #5   Title Patient to demonstrate 75% improvement in tolerance for papation and tone of LB and hip musculature.    Time 6    Period Weeks    Status On-going                 Plan - 01/09/21 1733    Clinical Impression Statement Pt came in reporting improvement in LB since last visit. She noted some pain in the crease of her R knee but noted that it is normal for her. Exercises took longer with pt d/t delays in interpretation. She had trouble understanding pelvic tilt in supine position and required a lot of cueing and instruction to perform them. She was able to complete pelvic tilts in seated postion w/o any problems. She reported some pinching in her R low back while sitting but noted that it was tolerable. She was unable to do lumbar extension d/t pain, and is limited in all motions  except flexion with pain in all directions.    Personal Factors and Comorbidities Age;Comorbidity 2;Past/Current Experience;Profession;Time since onset of injury/illness/exacerbation    Comorbidities HA, depression    PT Frequency 2x / week    PT Duration 6 weeks    PT Treatment/Interventions ADLs/Self Care Home Management;Cryotherapy;Electrical Stimulation;Moist Heat;Traction;Balance training;Therapeutic exercise;Therapeutic activities;Functional mobility training;Stair training;Gait training;Ultrasound;Neuromuscular re-education;Patient/family education;Manual techniques;Taping;Energy conservation;Dry needling;Passive range of motion    PT Next Visit Plan progress lumbopelvic ROM and hip stretching to tolerance; STM and modalities as needed    Consulted and Agree with Plan of Care Patient           Patient will benefit from skilled therapeutic intervention in order to improve the following deficits and impairments:  Decreased activity tolerance,Decreased strength,Increased fascial restricitons,Pain,Difficulty walking,Increased muscle spasms,Improper body mechanics,Decreased range of motion,Impaired flexibility,Postural dysfunction  Visit Diagnosis: Acute bilateral low back pain with bilateral sciatica  Other muscle spasm  Other symptoms and signs involving the musculoskeletal system  Difficulty in walking, not elsewhere classified     Problem List Patient Active Problem List   Diagnosis Date Noted  . Lumbar radiculopathy 12/18/2020  . HLD (hyperlipidemia) 01/24/2020  . Endometriosis 01/24/2020    Artist Pais, PTA 01/09/2021, 5:49 PM  [facility] [address]  [place], [place], [address] Phone: [phone]   Fax:  [phone]  Name: Tricia Lucas MRN: [id number] Date of Birth: [birth year]

## 2021-01-13 ENCOUNTER — Other Ambulatory Visit: Payer: Self-pay

## 2021-01-13 ENCOUNTER — Ambulatory Visit: Payer: 59

## 2021-01-13 DIAGNOSIS — R29898 Other symptoms and signs involving the musculoskeletal system: Secondary | ICD-10-CM

## 2021-01-13 DIAGNOSIS — M5442 Lumbago with sciatica, left side: Secondary | ICD-10-CM | POA: Diagnosis not present

## 2021-01-13 DIAGNOSIS — R262 Difficulty in walking, not elsewhere classified: Secondary | ICD-10-CM

## 2021-01-13 DIAGNOSIS — M62838 Other muscle spasm: Secondary | ICD-10-CM

## 2021-01-13 DIAGNOSIS — M5441 Lumbago with sciatica, right side: Secondary | ICD-10-CM

## 2021-01-13 NOTE — Therapy (Signed)
[facility] [address]  [place], [place], [address] Phone: [phone]   Fax:  [phone]  Physical Therapy Treatment  Patient Details  Name: Tricia Lucas MRN: [id number] Date of Birth: 20-Dec-1970 Referring Provider (PT): Clearance Coots, MD   Encounter Date: 01/13/2021   PT End of Session - 01/13/21 1820    Visit Number 3    Number of Visits 13    Date for PT Re-Evaluation 02/11/21    Authorization Type [employer]    Authorization - Number of Visits 30    PT Start Time 1702    PT Stop Time 1743    PT Time Calculation (min) 41 min    Activity Tolerance Patient tolerated treatment well    Behavior During Therapy [facility] for tasks assessed/performed           Past Medical History:  Diagnosis Date  . Depression   . Headache     Past Surgical History:  Procedure Laterality Date  . ABLATION    . CESAREAN SECTION    . CHOLECYSTECTOMY  2005    There were no vitals filed for this visit.   Subjective Assessment - 01/13/21 1704    Subjective Pt reports she is doing good. The cold weather over the weekend made her more stiff and painful.    Patient is accompained by: Interpreter    Pertinent History HA, depression    Diagnostic tests 12/12/20 lumbar xray: negative    Patient Stated Goals decrease pain    Currently in Pain? Yes    Pain Score 4     Pain Location Back    Pain Orientation Right;Lower    Pain Descriptors / Indicators Aching;Constant    Pain Type Acute pain;Chronic pain                             [facility] Adult PT Treatment/Exercise - 01/13/21 0001      Exercises   Exercises Lumbar      Lumbar Exercises: Stretches   Pelvic Tilt 10 reps    Pelvic Tilt Limitations seated      Lumbar Exercises: Aerobic   Nustep L1x35min      Lumbar Exercises: Seated   Sit to Stand 10 reps    Other Seated Lumbar Exercises 3 way prayer stretch 5x10" with orange pball    Other Seated  Lumbar Exercises trunk rotation with Y Tband 10 reps both sides      Manual Therapy   Manual Therapy Soft tissue mobilization;Myofascial release    Soft tissue mobilization STM to B lumbar paraspinals    Myofascial Release manual TPR to B lumbar paraspinals                    PT Short Term Goals - 01/09/21 1710      PT SHORT TERM GOAL #1   Title Patient to be independent with initial HEP.    Time 3    Period Weeks    Status On-going    Target Date 01/21/21             PT Long Term Goals - 01/09/21 1710      PT LONG TERM GOAL #1   Title Patient to be independent with advanced HEP.    Time 6    Period Weeks    Status On-going      PT LONG TERM GOAL #2  Title Patient to demonstrate B LE strength >/=4+/5.    Time 6    Period Weeks    Status On-going      PT LONG TERM GOAL #3   Title Patient to demonstrate lumbar AROM WFL and without pain limiting.    Time 6    Period Weeks    Status On-going      PT LONG TERM GOAL #4   Title Patient to report tolerance for a full work shift with <3/10 pain.    Time 6    Period Weeks    Status On-going      PT LONG TERM GOAL #5   Title Patient to demonstrate 75% improvement in tolerance for papation and tone of LB and hip musculature.    Time 6    Period Weeks    Status On-going                 Plan - 01/13/21 1825    Clinical Impression Statement Pt did well today, responded well to [facility] lumbar paraspinals, reporting less tightness and pain post MT. She would continue to benefit from [facility] to decrease tightness in her low back. Educated her on using heat and continuing with stretching to loosen muscles in the low back. Pt required cueing during exercises to relax shoulders and to keep good posture to prevent stress on the spine. She may would benefit from posutre reeducation as well.    Personal Factors and Comorbidities Age;Comorbidity 2;Past/Current Experience;Profession;Time since onset of  injury/illness/exacerbation    Comorbidities HA, depression    PT Frequency 2x / week    PT Duration 6 weeks    PT Treatment/Interventions ADLs/Self Care Home Management;Cryotherapy;Electrical Stimulation;Moist Heat;Traction;Balance training;Therapeutic exercise;Therapeutic activities;Functional mobility training;Stair training;Gait training;Ultrasound;Neuromuscular re-education;Patient/family education;Manual techniques;Taping;Energy conservation;Dry needling;Passive range of motion    PT Next Visit Plan progress lumbopelvic ROM and hip stretching to tolerance; STM and modalities as needed; posture re-ed    Consulted and Agree with Plan of Care Patient           Patient will benefit from skilled therapeutic intervention in order to improve the following deficits and impairments:  Decreased activity tolerance,Decreased strength,Increased fascial restricitons,Pain,Difficulty walking,Increased muscle spasms,Improper body mechanics,Decreased range of motion,Impaired flexibility,Postural dysfunction  Visit Diagnosis: Acute bilateral low back pain with bilateral sciatica  Other muscle spasm  Other symptoms and signs involving the musculoskeletal system  Difficulty in walking, not elsewhere classified     Problem List Patient Active Problem List   Diagnosis Date Noted  . Lumbar radiculopathy 12/18/2020  . HLD (hyperlipidemia) 01/24/2020  . Endometriosis 01/24/2020    Artist Pais, PTA 01/13/2021, 6:34 PM  [facility] [address]  [place], [place], [address] Phone: [phone]   Fax:  [phone]  Name: Tricia Lucas MRN: [id number] Date of Birth: [birth year]

## 2021-01-16 ENCOUNTER — Ambulatory Visit (INDEPENDENT_AMBULATORY_CARE_PROVIDER_SITE_OTHER): Payer: 59 | Admitting: Family Medicine

## 2021-01-16 ENCOUNTER — Encounter: Payer: Self-pay | Admitting: Family Medicine

## 2021-01-16 ENCOUNTER — Other Ambulatory Visit: Payer: Self-pay

## 2021-01-16 ENCOUNTER — Ambulatory Visit: Payer: 59

## 2021-01-16 DIAGNOSIS — M62838 Other muscle spasm: Secondary | ICD-10-CM

## 2021-01-16 DIAGNOSIS — R29898 Other symptoms and signs involving the musculoskeletal system: Secondary | ICD-10-CM

## 2021-01-16 DIAGNOSIS — M5441 Lumbago with sciatica, right side: Secondary | ICD-10-CM

## 2021-01-16 DIAGNOSIS — M5416 Radiculopathy, lumbar region: Secondary | ICD-10-CM

## 2021-01-16 DIAGNOSIS — M5442 Lumbago with sciatica, left side: Secondary | ICD-10-CM | POA: Diagnosis not present

## 2021-01-16 DIAGNOSIS — R262 Difficulty in walking, not elsewhere classified: Secondary | ICD-10-CM

## 2021-01-16 NOTE — Patient Instructions (Signed)
Good to see you Please continue physical therapy  Please try the doctor the epidural   Please send me a message in [place] with any questions or updates.  Please see [place] back as needed.   --Dr. Lorina Rabon bueno verte Por favor, contine con la fisioterapia. Por favor, intente con el mdico la epidural. Enveme un mensaje en MyChart con cualquier pregunta o actualizacin. Vuelva a vernos cuando sea necesario.

## 2021-01-16 NOTE — Therapy (Signed)
[facility] [address]  [place], [place], [address] Phone: [phone]   Fax:  [phone]  Physical Therapy Treatment  Patient Details  Name: Tricia Lucas MRN: [id number] Date of Birth: [birth year] Referring Provider (PT): Clearance Coots, MD   Encounter Date: 01/16/2021   PT End of Session - 01/16/21 1446    Visit Number 4    Number of Visits 13    Date for PT Re-Evaluation 02/11/21    Authorization Type [employer]    Authorization - Number of Visits 30    PT Start Time 6269    PT Stop Time 1443    PT Time Calculation (min) 40 min    Activity Tolerance Patient tolerated treatment well    Behavior During Therapy [facility] for tasks assessed/performed           Past Medical History:  Diagnosis Date  . Depression   . Headache     Past Surgical History:  Procedure Laterality Date  . ABLATION    . CESAREAN SECTION    . CHOLECYSTECTOMY  2005    There were no vitals filed for this visit.   Subjective Assessment - 01/16/21 1411    Subjective Pt is doing well, the massage helps a lot.    Patient is accompained by: Interpreter    Pertinent History HA, depression    Diagnostic tests 12/12/20 lumbar xray: negative    Patient Stated Goals decrease pain    Currently in Pain? Yes    Pain Score 3     Pain Location Back    Pain Orientation Right;Left;Lower    Pain Descriptors / Indicators Aching;Constant    Pain Type Acute pain;Chronic pain                             [facility] Adult PT Treatment/Exercise - 01/16/21 0001      Lumbar Exercises: Stretches   Lower Trunk Rotation 5 reps   3" holds     Lumbar Exercises: Aerobic   Nustep L3x27min      Lumbar Exercises: Supine   Clam 10 reps;2 seconds    Clam Limitations R Tband    Bent Knee Raise 10 reps    Bent Knee Raise Limitations TrA brace and R Tband    Bridge 10 reps      Manual Therapy   Manual Therapy Soft tissue  mobilization;Myofascial release    Soft tissue mobilization STM to B lumbar paraspinals    Myofascial Release manual TPR to B lumbar paraspinals                  PT Education - 01/16/21 1454    Education Details HEP update: Access Code: [email or web address]    Person(s) Educated Patient    Methods Explanation;Demonstration;Verbal cues;Handout    Comprehension Verbalized understanding;Returned demonstration            PT Short Term Goals - 01/09/21 1710      PT SHORT TERM GOAL #1   Title Patient to be independent with initial HEP.    Time 3    Period Weeks    Status On-going    Target Date 01/21/21             PT Long Term Goals - 01/09/21 1710      PT LONG TERM GOAL #1   Title Patient to be independent with advanced HEP.    Time 6  Period Weeks    Status On-going      PT LONG TERM GOAL #2   Title Patient to demonstrate B LE strength >/=4+/5.    Time 6    Period Weeks    Status On-going      PT LONG TERM GOAL #3   Title Patient to demonstrate lumbar AROM WFL and without pain limiting.    Time 6    Period Weeks    Status On-going      PT LONG TERM GOAL #4   Title Patient to report tolerance for a full work shift with <3/10 pain.    Time 6    Period Weeks    Status On-going      PT LONG TERM GOAL #5   Title Patient to demonstrate 75% improvement in tolerance for papation and tone of LB and hip musculature.    Time 6    Period Weeks    Status On-going                 Plan - 01/16/21 1456    Clinical Impression Statement Pt came in reporting improvements in her LBP, continues to note good responses from [facility] and stretches. Added in more lumbopelvic strengthening exercises today, she reported increased pulling in the muscles worked and needed cueing to complete full ROM. She does note pain with trunk rotations along the QL area and has a lot of ttp on both sides. So far she is doing well with decreased pain levels but biggest limitation seems to be  tightness in the muscles of the trunk.    Personal Factors and Comorbidities Age;Comorbidity 2;Past/Current Experience;Profession;Time since onset of injury/illness/exacerbation    Comorbidities HA, depression    PT Frequency 2x / week    PT Duration 6 weeks    PT Treatment/Interventions ADLs/Self Care Home Management;Cryotherapy;Electrical Stimulation;Moist Heat;Traction;Balance training;Therapeutic exercise;Therapeutic activities;Functional mobility training;Stair training;Gait training;Ultrasound;Neuromuscular re-education;Patient/family education;Manual techniques;Taping;Energy conservation;Dry needling;Passive range of motion    PT Next Visit Plan progress lumbopelvic ROM and hip stretching to tolerance; STM and modalities as needed; posture re-ed    Consulted and Agree with Plan of Care Patient           Patient will benefit from skilled therapeutic intervention in order to improve the following deficits and impairments:  Decreased activity tolerance,Decreased strength,Increased fascial restricitons,Pain,Difficulty walking,Increased muscle spasms,Improper body mechanics,Decreased range of motion,Impaired flexibility,Postural dysfunction  Visit Diagnosis: Acute bilateral low back pain with bilateral sciatica  Other muscle spasm  Other symptoms and signs involving the musculoskeletal system  Difficulty in walking, not elsewhere classified     Problem List Patient Active Problem List   Diagnosis Date Noted  . Lumbar radiculopathy 12/18/2020  . HLD (hyperlipidemia) 01/24/2020  . Endometriosis 01/24/2020    Artist Pais, PTA 01/16/2021, 3:05 PM  [facility] [address]  [place], [place], [address] Phone: [phone]   Fax:  [phone]  Name: Tricia Lucas MRN: [id number] Date of Birth: 08/10/1971

## 2021-01-16 NOTE — Progress Notes (Signed)
  Tricia Lucas - 50 y.o. female MRN [id number]  Date of birth: [birth year]  SUBJECTIVE:  Including CC & ROS.  No chief complaint on file.   Tricia Lucas is a 50 y.o. female that is following up for her lumbar radiculopathy.  Has gotten improvement with physical therapy.  Pain is still occurring however and has been taking gabapentin just at night.  It does cause her to feel fatigued and drowsy.   Review of Systems See HPI   HISTORY: Past Medical, Surgical, Social, and Family History Reviewed & Updated per EMR.   Pertinent Historical Findings include:  Past Medical History:  Diagnosis Date  . Depression   . Headache     Past Surgical History:  Procedure Laterality Date  . ABLATION    . CESAREAN SECTION    . CHOLECYSTECTOMY  2005    Family History  Problem Relation Age of Onset  . Diabetes Paternal Grandfather   . Diabetes Paternal Grandmother   . Diabetes Maternal Grandmother   . Diabetes Maternal Grandfather   . Diabetes Mother   . Diabetes Father   . Diabetes Sister   . Diabetes Brother   . Diabetes Sister   . Diabetes Sister   . Diabetes Sister   . Diabetes Brother     Social History   Socioeconomic History  . Marital status: Married    Spouse name: Not on file  . Number of children: Not on file  . Years of education: Not on file  . Highest education level: 12th grade  Occupational History  . Not on file  Tobacco Use  . Smoking status: Never Smoker  . Smokeless tobacco: Never Used  Vaping Use  . Vaping Use: Never used  Substance and Sexual Activity  . Alcohol use: No  . Drug use: No  . Sexual activity: Not on file  Other Topics Concern  . Not on file  Social History Narrative  . Not on file   Social Determinants of Health   Financial Resource Strain: Not on file  Food Insecurity: Not on file  Transportation Needs: No Transportation Needs  . Lack of Transportation (Medical): No  . Lack of Transportation (Non-Medical): No  Physical Activity: Not  on file  Stress: Not on file  Social Connections: Not on file  Intimate Partner Violence: Not on file     PHYSICAL EXAM:  VS: BP 124/80 (BP Location: Left Arm, Patient Position: Sitting, Cuff Size: Large)   Ht 5\' 5"  (1.651 m)   Wt 154 lb (69.9 kg)   BMI 25.63 kg/m  Physical Exam Gen: NAD, alert, cooperative with exam, well-appearing      ASSESSMENT & PLAN:   Lumbar radiculopathy Still having radicular pain.  Physical therapy has been helping but has not resolved the pain.  Gabapentin has made her feel fatigue -Counseled on home exercise therapy and supportive care. -Discontinue gabapentin. -Referral to Ortho care Dr. Ernestina Patches for consideration of epidural.

## 2021-01-16 NOTE — Assessment & Plan Note (Addendum)
Still having radicular pain.  Physical therapy has been helping but has not resolved the pain.  Gabapentin has made her feel fatigue -Counseled on home exercise therapy and supportive care. -Discontinue gabapentin. -Referral to Ortho care Dr. Ernestina Patches for consideration of epidural.

## 2021-01-20 ENCOUNTER — Ambulatory Visit: Payer: No Typology Code available for payment source

## 2021-01-22 ENCOUNTER — Telehealth: Payer: Self-pay | Admitting: Physical Medicine and Rehabilitation

## 2021-01-22 NOTE — Telephone Encounter (Signed)
Received call from Jonelle Sidle with [employer] asking fr a call back concerning the steroid injection. The number to contact Jonelle Sidle is [phone]. Jonelle Sidle said to ask for her

## 2021-01-22 NOTE — Telephone Encounter (Signed)
Pt was approve.

## 2021-01-23 ENCOUNTER — Ambulatory Visit: Payer: 59 | Admitting: Physical Therapy

## 2021-01-23 ENCOUNTER — Other Ambulatory Visit: Payer: Self-pay

## 2021-01-23 ENCOUNTER — Encounter: Payer: Self-pay | Admitting: Physical Therapy

## 2021-01-23 DIAGNOSIS — M5442 Lumbago with sciatica, left side: Secondary | ICD-10-CM | POA: Diagnosis not present

## 2021-01-23 DIAGNOSIS — R29898 Other symptoms and signs involving the musculoskeletal system: Secondary | ICD-10-CM

## 2021-01-23 DIAGNOSIS — M5441 Lumbago with sciatica, right side: Secondary | ICD-10-CM

## 2021-01-23 DIAGNOSIS — R262 Difficulty in walking, not elsewhere classified: Secondary | ICD-10-CM

## 2021-01-23 DIAGNOSIS — M62838 Other muscle spasm: Secondary | ICD-10-CM

## 2021-01-23 NOTE — Therapy (Addendum)
[facility] [address]  [place], [place], [address] Phone: [phone]   Fax:  [phone]  Physical Therapy Treatment  Patient Details  Name: Tricia Lucas MRN: [id number] Date of Birth: 10/21/1970 Referring Provider (PT): Clearance Coots, MD   Encounter Date: 01/23/2021   PT End of Session - 01/23/21 1657     Visit Number 5    Number of Visits 13    Date for PT Re-Evaluation 02/11/21    Authorization Type [employer]    Authorization - Number of Visits 30    PT Start Time 1616    PT Stop Time 1656    PT Time Calculation (min) 40 min    Activity Tolerance Patient tolerated treatment well    Behavior During Therapy [facility] for tasks assessed/performed             Past Medical History:  Diagnosis Date   Depression    Headache     Past Surgical History:  Procedure Laterality Date   ABLATION     CESAREAN SECTION     CHOLECYSTECTOMY  2005    There were no vitals filed for this visit.   Subjective Assessment - 01/23/21 1617     Subjective Feeling better d/t HEP and yoga. Planning on spinal epidural- still waiting for a call.    Patient is accompained by: Interpreter    Pertinent History HA, depression    Diagnostic tests 12/12/20 lumbar xray: negative    Patient Stated Goals decrease pain    Currently in Pain? No/denies                               [facility] Adult PT Treatment/Exercise - 01/23/21 0001       Lumbar Exercises: Stretches   Hip Flexor Stretch Right;Left;1 rep;30 seconds    Hip Flexor Stretch Limitations mod thomas with strap    Gastroc Stretch Right;Left;1 rep;30 seconds    Gastroc Stretch Limitations toes on wall      Lumbar Exercises: Aerobic   Nustep L3x37mn (UEs/LEs)      Lumbar Exercises: Standing   Functional Squats 10 reps    Functional Squats Limitations good form    Other Standing Lumbar Exercises sidestepping with red loop around ankles 4x 253f   c/o considerable muscle burn; lateral trunk lean     Lumbar Exercises: Seated   Other Seated Lumbar Exercises R/.L paloff press red TB 10x standing   good tolerance     Lumbar Exercises: Supine   Other Supine Lumbar Exercises hooklying resisted hip flexion red TB 10x each      Lumbar Exercises: Sidelying   Other Sidelying Lumbar Exercises R/L open book stretch 10x each   good ROM                   PT Education - 01/23/21 1657     Education Details update to HEP    Person(s) Educated Patient    Methods Explanation;Demonstration;Tactile cues;Verbal cues;Handout    Comprehension Verbalized understanding;Returned demonstration              PT Short Term Goals - 01/23/21 1657       PT SHORT TERM GOAL #1   Title Patient to be independent with initial HEP.    Time 3    Period Weeks    Status Achieved    Target Date 01/21/21  PT Long Term Goals - 01/09/21 1710       PT LONG TERM GOAL #1   Title Patient to be independent with advanced HEP.    Time 6    Period Weeks    Status On-going      PT LONG TERM GOAL #2   Title Patient to demonstrate B LE strength >/=4+/5.    Time 6    Period Weeks    Status On-going      PT LONG TERM GOAL #3   Title Patient to demonstrate lumbar AROM WFL and without pain limiting.    Time 6    Period Weeks    Status On-going      PT LONG TERM GOAL #4   Title Patient to report tolerance for a full work shift with <3/10 pain.    Time 6    Period Weeks    Status On-going      PT LONG TERM GOAL #5   Title Patient to demonstrate 75% improvement in tolerance for papation and tone of LB and hip musculature.    Time 6    Period Weeks    Status On-going                   Plan - 01/23/21 1657     Clinical Impression Statement Patient arrived to session pain-free with report of improved LBP since working on her HEP and yoga. Planning on getting a spinal epidural per MD's recommendation. D/t patient's  previous c/o B foot pain in the AM, introduced gentle gastroc stretching with good tolerance. Educated patient on etiology and treatment for plantar fasciitis. Worked on gentle lumbopelvic stretching and core strengthening. Patient did report c/o LBP with resisted trunk rotation. Instructed on paloff press which was better-tolerated. Squats were initiated and performed with excellent form after instruction. Updated HEP with exercises that were well-tolerated today. Patient reported understanding. Patient reported no pain at end of session, only muscle fatigue.    Personal Factors and Comorbidities Age;Comorbidity 2;Past/Current Experience;Profession;Time since onset of injury/illness/exacerbation    Comorbidities HA, depression    PT Frequency 2x / week    PT Duration 6 weeks    PT Treatment/Interventions ADLs/Self Care Home Management;Cryotherapy;Electrical Stimulation;Moist Heat;Traction;Balance training;Therapeutic exercise;Therapeutic activities;Functional mobility training;Stair training;Gait training;Ultrasound;Neuromuscular re-education;Patient/family education;Manual techniques;Taping;Energy conservation;Dry needling;Passive range of motion    PT Next Visit Plan progress lumbopelvic ROM and hip stretching to tolerance; STM and modalities as needed; posture re-ed    Consulted and Agree with Plan of Care Patient             Patient will benefit from skilled therapeutic intervention in order to improve the following deficits and impairments:  Decreased activity tolerance,Decreased strength,Increased fascial restricitons,Pain,Difficulty walking,Increased muscle spasms,Improper body mechanics,Decreased range of motion,Impaired flexibility,Postural dysfunction  Visit Diagnosis: Acute bilateral low back pain with bilateral sciatica  Other muscle spasm  Other symptoms and signs involving the musculoskeletal system  Difficulty in walking, not elsewhere classified     Problem List Patient  Active Problem List   Diagnosis Date Noted   Lumbar radiculopathy 12/18/2020   HLD (hyperlipidemia) 01/24/2020   Endometriosis 01/24/2020     Janene Harvey, PT, DPT 01/23/21 4:58 PM   [facility] [address]  [place], [place], [address] Phone: [phone]   Fax:  [phone]  Name: Donnamarie Shankles MRN: [id number] Date of Birth: September 21, 1970  PHYSICAL THERAPY DISCHARGE SUMMARY  Visits from Start of Care: 5  Current  functional level related to goals / functional outcomes: Unable to assess; patient did not return d/t COVID exposure    Remaining deficits: Unable to assess   Education / Equipment: HEP  Plan: Patient agrees to discharge.  Patient goals were not met. Patient is being discharged due to not returning since last visit.    Janene Harvey, PT, DPT 02/28/21 10:19 AM

## 2021-02-04 ENCOUNTER — Telehealth: Payer: Self-pay | Admitting: Physical Medicine and Rehabilitation

## 2021-02-04 NOTE — Telephone Encounter (Signed)
Patient's son called. Would like to [facility] her appointment with Dr. Ernestina Patches. His call back number is [phone]. Was wanting something on 6/10

## 2021-02-05 NOTE — Telephone Encounter (Signed)
Called pt and son and they need to call back to check dates.

## 2021-02-10 ENCOUNTER — Ambulatory Visit: Payer: Self-pay

## 2021-02-10 ENCOUNTER — Other Ambulatory Visit: Payer: Self-pay

## 2021-02-10 ENCOUNTER — Ambulatory Visit (INDEPENDENT_AMBULATORY_CARE_PROVIDER_SITE_OTHER): Payer: 59 | Admitting: Physical Medicine and Rehabilitation

## 2021-02-10 ENCOUNTER — Encounter: Payer: Self-pay | Admitting: Physical Medicine and Rehabilitation

## 2021-02-10 VITALS — BP 135/85 | HR 82

## 2021-02-10 DIAGNOSIS — M5416 Radiculopathy, lumbar region: Secondary | ICD-10-CM

## 2021-02-10 DIAGNOSIS — M48062 Spinal stenosis, lumbar region with neurogenic claudication: Secondary | ICD-10-CM | POA: Diagnosis not present

## 2021-02-10 MED ORDER — METHYLPREDNISOLONE ACETATE 80 MG/ML IJ SUSP
80.0000 mg | Freq: Once | INTRAMUSCULAR | Status: AC
  Administered 2021-02-10: 80 mg

## 2021-02-10 NOTE — Progress Notes (Signed)
Pt state Lower back pian that travels down both leg mostly her right side. Pt state the pain travels to her ankle. Pt state sitting for a long time makes the pain worse. Pt state she takes pain meds and uses heating pads.  Numeric Pain Rating Scale and Functional Assessment Average Pain 3   In the last MONTH (on 0-10 scale) has pain interfered with the following?  1. General activity like being  able to carry out your everyday physical activities such as walking, climbing stairs, carrying groceries, or moving a chair?  Rating(10)   +Driver, -BT, -Dye Allergies.

## 2021-02-10 NOTE — Patient Instructions (Signed)

## 2021-02-11 ENCOUNTER — Encounter: Payer: Self-pay | Admitting: Physical Medicine and Rehabilitation

## 2021-02-11 NOTE — Progress Notes (Signed)
Tricia Lucas - 51 y.o. female MRN [id number]  Date of birth: 12/15/1970  Office Visit Note: Visit Date: 02/10/2021 PCP: Pcp, No Referred by: Rosemarie Ax, MD  Subjective: Chief Complaint  Patient presents with  . Lower Back - Pain  . Left Leg - Pain  . Right Leg - Pain  . Right Ankle - Pain   HPI: Tricia Lucas is a 50 y.o. female who comes in today At the request of Dr. Clearance Coots for evaluation and management of chronic worsening severe low back pain with bilateral hip and leg pain right worse than left.  The patient does have an interpreter today.  According to the patient and her prior notes that we did review she has had chronic back pain and pelvic pain really for many years.  Most recent episode of acute onset worsening was prior to April of this year.  She has been treated with medications including gabapentin and anti-inflammatories as well as physical therapy.  She has had physical therapy at [facility].  Dr. Raeford Razor has treated her very well from a conservative standpoint and ultimately obtained MRI of the lumbar spine.  She reports average 3 out of 10 pain but it can worsen to the point where she has a hard time with general activities.  She reports significant issues with daily activities and functional status.  She reports lower back pain particular on the right side down the hip and leg pretty consistent with an L5 dermatome.  She does report worsening pain with prolonged sitting but also some pain with standing and moving.  She denies any focal weakness or bowel or bladder changes.  Her case is complicated by history of depression anxiety with history of panic disorder.  Some issues with chronic pain in general.  She has not had prior epidural injection.  She has had no prior surgeries.  She did not tolerate gabapentin very well and this was discontinued.  MRI is reviewed with the patient today using spine models and imaging.  She has small disc  protrusions in the upper lumbar region mostly left-sided not right-sided and these are very small but do pose a chance of causing pain.  Her biggest finding really is at L4-5 where it looks like she has multifactorial stenosis at least moderate.  There is ligamentum flavum thickening.  There is a disc protrusion moderate probably newer and this is probably the culprit combined with the base level of stenosis of what is causing her pain.  Review of Systems  Musculoskeletal: Positive for back pain.       Right more than left radicular leg pain  Neurological: Positive for tingling.  All other systems reviewed and are negative.  Otherwise per HPI.  Assessment & Plan: Visit Diagnoses:    ICD-10-CM   1. Lumbar radiculopathy  M54.16 XR C-ARM NO REPORT    Epidural Steroid injection    methylPREDNISolone acetate (DEPO-MEDROL) injection 80 mg  2. Spinal stenosis of lumbar region with neurogenic claudication  M48.062      Plan: Findings:  Chronic long-term history of back pain now with several months of recalcitrant low back pain with right more than left radicular leg pain more of an L5 distribution consistent with findings at L4-5 on the MRI.  She is tried and failed all manner of conservative care including good physical therapy which did help some but did not relieve the symptoms to the degree that she needed.  She has had  medication management including nerve membrane medications and anti-inflammatories without really good relief.  I do think the neck step is diagnostic medically therapeutic bilateral L4 transforaminal injection.  Would consider interlaminar approach depending on how well she did.  She could obviously have an injection or 2 if they help off and on and it could be an issue that if it was helping long enough we could do that.  I also discussed with her surgical decompression.  With this 1 level problem especially if the injections diagnostic she may do well with a 1 level decompression  discectomy.  I doubt that she would need any other surgery than that and it probably would relieve her leg pain quite a bit.  She may still have some pain in general from the arthritic changes and underlying pain syndrome.    Meds & Orders:  Meds ordered this encounter  Medications  . methylPREDNISolone acetate (DEPO-MEDROL) injection 80 mg    Orders Placed This Encounter  Procedures  . XR C-ARM NO REPORT  . Epidural Steroid injection    Follow-up: Return if symptoms worsen or fail to improve.   Procedures: No procedures performed  Lumbosacral Transforaminal Epidural Steroid Injection - Sub-Pedicular Approach with Fluoroscopic Guidance  Patient: Tricia Lucas      Date of Birth: 1971-06-19 MRN: [id number] PCP: Pcp, No      Visit Date: 02/10/2021   Universal Protocol:    Date/Time: 02/10/2021  Consent Given By: the patient  Position: PRONE  Additional Comments: Vital signs were monitored before and after the procedure. Patient was prepped and draped in the usual sterile fashion. The correct patient, procedure, and site was verified.   Injection Procedure Details:   Procedure diagnoses: Lumbar radiculopathy [M54.16]    Meds Administered:  Meds ordered this encounter  Medications  . methylPREDNISolone acetate (DEPO-MEDROL) injection 80 mg    Laterality: Bilateral  Location/Site:  L4-L5  Needle:5.0 in., 22 ga.  Short bevel or Quincke spinal needle  Needle Placement: Transforaminal  Findings:    -Comments: Excellent flow of contrast along the nerve, nerve root and into the epidural space.  Procedure Details: After squaring off the end-plates to get a true AP view, the C-arm was positioned so that an oblique view of the foramen as noted above was visualized. The target area is just inferior to the "nose of the scotty dog" or sub pedicular. The soft tissues overlying this structure were infiltrated with 2-3 ml. of 1% Lidocaine without Epinephrine.  The spinal  needle was inserted toward the target using a "trajectory" view along the fluoroscope beam.  Under AP and lateral visualization, the needle was advanced so it did not puncture dura and was located close the 6 O'Clock position of the pedical in AP tracterory. Biplanar projections were used to confirm position. Aspiration was confirmed to be negative for CSF and/or blood. A 1-2 ml. volume of Isovue-250 was injected and flow of contrast was noted at each level. Radiographs were obtained for documentation purposes.   After attaining the desired flow of contrast documented above, a 0.5 to 1.0 ml test dose of 0.25% Marcaine was injected into each respective transforaminal space.  The patient was observed for 90 seconds post injection.  After no sensory deficits were reported, and normal lower extremity motor function was noted,   the above injectate was administered so that equal amounts of the injectate were placed at each foramen (level) into the transforaminal epidural space.   Additional Comments:  The patient tolerated the  procedure well Dressing: 2 x 2 sterile gauze and Band-Aid    Post-procedure details: Patient was observed during the procedure. Post-procedure instructions were reviewed.  Patient left the clinic in stable condition.      Clinical History: MRI LUMBAR SPINE WITHOUT CONTRAST  TECHNIQUE: Multiplanar, multisequence MR imaging of the lumbar spine was performed. No intravenous contrast was administered.  COMPARISON:  Lumbar radiographs 12/12/2020  FINDINGS: Segmentation: There are five lumbar type vertebral bodies. The last full intervertebral disc space is labeled L5-S1. This correlates with the lumbar radiographs.  Alignment:  Normal  Vertebrae:  Normal marrow signal.  No bone lesions or fractures.  Conus medullaris and cauda equina: Conus extends to the L1 level. Conus and cauda equina appear normal.  Paraspinal and other soft tissues: No significant  paraspinal or retroperitoneal findings.  Disc levels:  L1-2: No significant findings.  L2-3: Small focal left foraminal disc protrusion appears to contact the left L2 nerve root and certainly could irritated. There is also mild bilateral lateral recess encroachment without significant stenosis.  L3-4: Mild annular bulge and shallow central disc protrusion in combination with short pedicles and mild facet disease contributing to mild/early spinal and bilateral lateral recess stenosis. There is also a shallow left foraminal disc protrusion contacting and slightly displacing the left L3 nerve root.  L4-5: Annular fissure and small to moderate-sized central disc protrusion with mass effect on the ventral thecal sac. This in combination with mild facet disease and ligamentum flavum thickening contributes to moderate spinal and bilateral lateral recess stenosis. No foraminal stenosis.  L5-S1: Annular fissure and small central disc protrusion with minimal impression on the ventral thecal sac. No direct neural compression. No foraminal stenosis.  IMPRESSION: 1. Small focal left foraminal disc protrusion at L2-3. 2. Mild/early spinal and bilateral lateral recess stenosis at L3-4. There is also a shallow left foraminal disc protrusion contacting and slightly displacing the left L3 nerve root. 3. Annular fissure and small to moderate-sized central disc protrusion at L4-5 contributing to moderate spinal and bilateral lateral recess stenosis. 4. Annular fissure and small central disc protrusion at L5-S1.   Electronically Signed   By: Marijo Sanes M.D.   On: 12/29/2020 10:25   She reports that she has never smoked. She has never used smokeless tobacco. No results for input(s): HGBA1C, LABURIC in the last 8760 hours.  Objective:  VS:  HT:    WT:   BMI:     BP:135/85  HR:82bpm  TEMP: ( )  RESP:  Physical Exam Vitals and nursing note reviewed.  Constitutional:       General: She is not in acute distress.    Appearance: Normal appearance. She is not ill-appearing.  HENT:     Head: Normocephalic and atraumatic.     Right Ear: External ear normal.     Left Ear: External ear normal.  Eyes:     Extraocular Movements: Extraocular movements intact.  Cardiovascular:     Rate and Rhythm: Normal rate.     Pulses: Normal pulses.  Pulmonary:     Effort: Pulmonary effort is normal. No respiratory distress.  Abdominal:     General: There is no distension.     Palpations: Abdomen is soft.  Musculoskeletal:        General: Tenderness present.     Cervical back: Neck supple.     Right lower leg: No edema.     Left lower leg: No edema.     Comments: Patient has good distal strength  with no pain over the greater trochanters.  She does have some tender spots over the PSIS and trochanters.  No clonus or focal weakness.  She ambulates without aid.  She has somewhat of a forward flexed spine.  She does have pain with extension and facet loading.  Skin:    Findings: No erythema, lesion or rash.  Neurological:     General: No focal deficit present.     Mental Status: She is alert and oriented to person, place, and time.     Sensory: No sensory deficit.     Motor: No weakness or abnormal muscle tone.     Coordination: Coordination normal.  Psychiatric:        Mood and Affect: Mood normal.        Behavior: Behavior normal.     Ortho Exam  Imaging: XR C-ARM NO REPORT  Result Date: 02/10/2021 Please see Notes tab for imaging impression.   Past Medical/Family/Surgical/Social History: Medications & Allergies reviewed per EMR, new medications updated. Patient Active Problem List   Diagnosis Date Noted  . Lumbar radiculopathy 12/18/2020  . HLD (hyperlipidemia) 01/24/2020  . Endometriosis 01/24/2020   Past Medical History:  Diagnosis Date  . Depression   . Headache    Family History  Problem Relation Age of Onset  . Diabetes Paternal Grandfather   .  Diabetes Paternal Grandmother   . Diabetes Maternal Grandmother   . Diabetes Maternal Grandfather   . Diabetes Mother   . Diabetes Father   . Diabetes Sister   . Diabetes Brother   . Diabetes Sister   . Diabetes Sister   . Diabetes Sister   . Diabetes Brother    Past Surgical History:  Procedure Laterality Date  . ABLATION    . CESAREAN SECTION    . CHOLECYSTECTOMY  2005   Social History   Occupational History  . Not on file  Tobacco Use  . Smoking status: Never Smoker  . Smokeless tobacco: Never Used  Vaping Use  . Vaping Use: Never used  Substance and Sexual Activity  . Alcohol use: No  . Drug use: No  . Sexual activity: Not on file

## 2021-02-11 NOTE — Procedures (Signed)
Lumbosacral Transforaminal Epidural Steroid Injection - Sub-Pedicular Approach with Fluoroscopic Guidance  Patient: Tricia Lucas      Date of Birth: 1970/11/26 MRN: [id number] PCP: Pcp, No      Visit Date: 02/10/2021   Universal Protocol:    Date/Time: 02/10/2021  Consent Given By: the patient  Position: PRONE  Additional Comments: Vital signs were monitored before and after the procedure. Patient was prepped and draped in the usual sterile fashion. The correct patient, procedure, and site was verified.   Injection Procedure Details:   Procedure diagnoses: Lumbar radiculopathy [M54.16]    Meds Administered:  Meds ordered this encounter  Medications  . methylPREDNISolone acetate (DEPO-MEDROL) injection 80 mg    Laterality: Bilateral  Location/Site:  L4-L5  Needle:5.0 in., 22 ga.  Short bevel or Quincke spinal needle  Needle Placement: Transforaminal  Findings:    -Comments: Excellent flow of contrast along the nerve, nerve root and into the epidural space.  Procedure Details: After squaring off the end-plates to get a true AP view, the C-arm was positioned so that an oblique view of the foramen as noted above was visualized. The target area is just inferior to the "nose of the scotty dog" or sub pedicular. The soft tissues overlying this structure were infiltrated with 2-3 ml. of 1% Lidocaine without Epinephrine.  The spinal needle was inserted toward the target using a "trajectory" view along the fluoroscope beam.  Under AP and lateral visualization, the needle was advanced so it did not puncture dura and was located close the 6 O'Clock position of the pedical in AP tracterory. Biplanar projections were used to confirm position. Aspiration was confirmed to be negative for CSF and/or blood. A 1-2 ml. volume of Isovue-250 was injected and flow of contrast was noted at each level. Radiographs were obtained for documentation purposes.   After attaining the desired flow of  contrast documented above, a 0.5 to 1.0 ml test dose of 0.25% Marcaine was injected into each respective transforaminal space.  The patient was observed for 90 seconds post injection.  After no sensory deficits were reported, and normal lower extremity motor function was noted,   the above injectate was administered so that equal amounts of the injectate were placed at each foramen (level) into the transforaminal epidural space.   Additional Comments:  The patient tolerated the procedure well Dressing: 2 x 2 sterile gauze and Band-Aid    Post-procedure details: Patient was observed during the procedure. Post-procedure instructions were reviewed.  Patient left the clinic in stable condition.

## 2021-12-16 ENCOUNTER — Emergency Department (HOSPITAL_BASED_OUTPATIENT_CLINIC_OR_DEPARTMENT_OTHER): Payer: 59

## 2021-12-16 ENCOUNTER — Encounter (HOSPITAL_BASED_OUTPATIENT_CLINIC_OR_DEPARTMENT_OTHER): Payer: Self-pay

## 2021-12-16 ENCOUNTER — Emergency Department (HOSPITAL_BASED_OUTPATIENT_CLINIC_OR_DEPARTMENT_OTHER)
Admission: EM | Admit: 2021-12-16 | Discharge: 2021-12-16 | Disposition: A | Discharge status: Home / Self Care | Payer: 59 | Attending: Emergency Medicine | Admitting: Emergency Medicine

## 2021-12-16 DIAGNOSIS — R112 Nausea with vomiting, unspecified: Secondary | ICD-10-CM | POA: Insufficient documentation

## 2021-12-16 DIAGNOSIS — R110 Nausea: Secondary | ICD-10-CM

## 2021-12-16 DIAGNOSIS — N83202 Unspecified ovarian cyst, left side: Secondary | ICD-10-CM | POA: Insufficient documentation

## 2021-12-16 DIAGNOSIS — N83209 Unspecified ovarian cyst, unspecified side: Secondary | ICD-10-CM

## 2021-12-16 DIAGNOSIS — R109 Unspecified abdominal pain: Secondary | ICD-10-CM | POA: Diagnosis present

## 2021-12-16 DIAGNOSIS — R1084 Generalized abdominal pain: Secondary | ICD-10-CM

## 2021-12-16 DIAGNOSIS — Z7982 Long term (current) use of aspirin: Secondary | ICD-10-CM | POA: Diagnosis not present

## 2021-12-16 HISTORY — DX: Unspecified ovarian cyst, unspecified side: N83.209

## 2021-12-16 LAB — URINALYSIS, ROUTINE W REFLEX MICROSCOPIC
Bilirubin Urine: NEGATIVE
Glucose, UA: NEGATIVE mg/dL
Hgb urine dipstick: NEGATIVE
Ketones, ur: NEGATIVE mg/dL
Leukocytes,Ua: NEGATIVE
Nitrite: NEGATIVE
Protein, ur: NEGATIVE mg/dL
Specific Gravity, Urine: 1.015 (ref 1.005–1.030)
pH: 6.5 (ref 5.0–8.0)

## 2021-12-16 LAB — COMPREHENSIVE METABOLIC PANEL
ALT: 20 U/L (ref 0–44)
AST: 21 U/L (ref 15–41)
Albumin: 4.6 g/dL (ref 3.5–5.0)
Alkaline Phosphatase: 57 U/L (ref 38–126)
Anion gap: 8 (ref 5–15)
BUN: 7 mg/dL (ref 6–20)
CO2: 24 mmol/L (ref 22–32)
Calcium: 9.1 mg/dL (ref 8.9–10.3)
Chloride: 104 mmol/L (ref 98–111)
Creatinine, Ser: 0.66 mg/dL (ref 0.44–1.00)
GFR, Estimated: >60 mL/min (ref >60)
Glucose, Bld: 103 mg/dL — ABNORMAL HIGH (ref 70–99)
Potassium: 3.8 mmol/L (ref 3.5–5.1)
Sodium: 136 mmol/L (ref 135–145)
Total Bilirubin: 0.6 mg/dL (ref 0.3–1.2)
Total Protein: 8 g/dL (ref 6.5–8.1)

## 2021-12-16 LAB — CBC
HCT: 46.3 % — ABNORMAL HIGH (ref 36.0–46.0)
Hemoglobin: 15.6 g/dL — ABNORMAL HIGH (ref 12.0–15.0)
MCH: 30 pg (ref 26.0–34.0)
MCHC: 33.7 g/dL (ref 30.0–36.0)
MCV: 89 fL (ref 80.0–100.0)
Platelets: 335 10*3/uL (ref 150–400)
RBC: 5.2 MIL/uL — ABNORMAL HIGH (ref 3.87–5.11)
RDW: 12.8 % (ref 11.5–15.5)
WBC: 9.4 10*3/uL (ref 4.0–10.5)
nRBC: 0 % (ref 0.0–0.2)

## 2021-12-16 LAB — PREGNANCY, URINE: Preg Test, Ur: NEGATIVE

## 2021-12-16 LAB — LIPASE, BLOOD: Lipase: 33 U/L (ref 11–51)

## 2021-12-16 MED ORDER — IBUPROFEN 800 MG PO TABS: 800.0000 mg | ORAL_TABLET | Freq: Three times a day (TID) | ORAL | 0 refills | Status: AC | PRN

## 2021-12-16 MED ORDER — ONDANSETRON HCL 4 MG/2ML IJ SOLN
4.0000 mg | Freq: Once | INTRAMUSCULAR | Status: AC
  Administered 2021-12-16: 4 mg via INTRAVENOUS
  Filled 2021-12-16: qty 2

## 2021-12-16 MED ORDER — IOHEXOL 300 MG/ML  SOLN
100.0000 mL | Freq: Once | INTRAMUSCULAR | Status: AC | PRN
  Administered 2021-12-16: 100 mL via INTRAVENOUS

## 2021-12-16 MED ORDER — ONDANSETRON 4 MG PO TBDP
4.0000 mg | ORAL_TABLET | Freq: Three times a day (TID) | ORAL | 0 refills | Status: DC | PRN
Start: 2021-12-16 — End: 2022-02-13

## 2021-12-16 NOTE — ED Notes (Signed)
Patient transported to CT 

## 2021-12-16 NOTE — ED Triage Notes (Signed)
C/o abdominal pain radiating to lower back. States abdomen feels distended. Last BM this morning. Nausea & vomiting. ? ?Spanish interpreter Tricia Lucas [phone] used during triage ?

## 2021-12-16 NOTE — Discharge Instructions (Addendum)
Como comentamos, su examen de hoy en la sala de emergencias revel? que tiene un quiste de 5 cm en el ovario izquierdo. Sospecho que esta es la causa de tu dolor. Le he dado [place] referencia a un ginec?logo con un n?mero para llamar y Psychiatrist cita lo antes posible para una evaluaci?n y [employer]. Tambi?n le he dado una receta para un medicamento para las n?useas Zofran e ibuprofeno para su dolor para que lo tome seg?n sea necesario. Regrese si se desarrollan s?ntomas nuevos o que empeoran. ?

## 2021-12-16 NOTE — ED Provider Notes (Signed)
?[place] EMERGENCY DEPARTMENT ?Provider Note ? ? ?CSN: [id number] ?Arrival date & time: 12/16/21  1137 ? ?  ? ?History ? ?Chief Complaint  ?Patient presents with  ? Abdominal Pain  ? ? ?Tricia Lucas is a 51 y.o. female. ? ?Patient with history of endometriosis and uterine ablation in in 2018 presents today with complaints of abdominal pain.  She states that same began yesterday with development of diffuse abdominal pain that has been persistent since and is now radiating to her back. She also feels that she has been more bloated recently. States that the pain is similar to when she has had flares of her endometriosis in the past.  She states she normally manages same with ibuprofen 800 mg.  Endorses some episodes of nausea since onset of pain with 1 episode of NBNB emesis this morning. Denies any diarrhea, last BM was this morning and was normal for her. She denies any dysuria, hematuria, vaginal discharge, vaginal bleeding, vaginal pain.  She states that her last menstrual cycle was in 2018 when she had her uterine ablation. Hx of cholecystectomy several years ago.  ? ?The history is provided by the patient. A language interpreter was used.  ?Abdominal Pain ?Associated symptoms: nausea and vomiting   ?Associated symptoms: no chills, no constipation, no diarrhea and no fever   ? ?  ? ?Home Medications ?Prior to Admission medications   ?Medication Sig Start Date End Date Taking? Authorizing Provider  ?aspirin-acetaminophen-caffeine (EXCEDRIN MIGRAINE) 250-250-65 MG tablet Take 1 tablet by mouth every 6 (six) hours as needed for headache.    [provider]  ?buPROPion (ZYBAN) 150 MG 12 hr tablet Take 150 mg by mouth 2 (two) times daily.    [provider]  ?gabapentin (NEURONTIN) 100 MG capsule Take 1 capsule (100 mg total) by mouth 3 (three) times daily. 01/02/21   Rosemarie Ax, MD  ?ibuprofen (ADVIL,MOTRIN) 800 MG tablet Take 1 tablet (800 mg total) by mouth 3 (three) times daily.  05/03/14   Fransico Meadow, PA-C  ?ketorolac (TORADOL) 10 MG tablet Take 1 tablet (10 mg total) by mouth every 6 (six) hours as needed. 12/12/20   Arnaldo Natal, MD  ?Topiramate ER (TROKENDI XR) 50 MG CP24 Take by mouth.    [provider]  ?   ? ?Allergies    ?Gabapentin   ? ?Review of Systems   ?Review of Systems  ?Constitutional:  Negative for chills and fever.  ?Gastrointestinal:  Positive for abdominal pain, nausea and vomiting. Negative for constipation and diarrhea.  ?Genitourinary:  Positive for pelvic pain.  ?Neurological:  Negative for dizziness, tremors, seizures, syncope, facial asymmetry, speech difficulty, weakness, light-headedness, numbness and headaches.  ?Psychiatric/Behavioral:  Negative for confusion and decreased concentration.   ?All other systems reviewed and are negative. ? ?Physical Exam ?Updated Vital Signs ?BP 127/72   Pulse 75   Temp 97.6 ?F (36.4 ?C) (Oral)   Resp 18   Ht '5\' 5"'$  (1.651 m)   Wt 72.6 kg   SpO2 97%   BMI 26.63 kg/m?  ?Physical Exam ?Vitals and nursing note reviewed.  ?Constitutional:   ?   General: She is not in acute distress. ?   Appearance: Normal appearance. She is well-developed and normal weight. She is not ill-appearing, toxic-appearing or diaphoretic.  ?   Comments: Patient resting comfortably in bed in no acute distress  ?HENT:  ?   Head: Normocephalic and atraumatic.  ?Cardiovascular:  ?   Rate and Rhythm:  Normal rate and regular rhythm.  ?   Heart sounds: Normal heart sounds.  ?Pulmonary:  ?   Effort: Pulmonary effort is normal. No respiratory distress.  ?   Breath sounds: Normal breath sounds.  ?Abdominal:  ?   General: Abdomen is flat. Bowel sounds are normal.  ?   Palpations: Abdomen is soft.  ?   Tenderness: There is generalized abdominal tenderness.  ?Musculoskeletal:     ?   General: Normal range of motion.  ?   Cervical back: Normal range of motion.  ?Skin: ?   General: Skin is warm and dry.  ?Neurological:  ?   General: No focal deficit  present.  ?   Mental Status: She is alert.  ?Psychiatric:     ?   Mood and Affect: Mood normal.     ?   Behavior: Behavior normal.  ? ? ?ED Results / Procedures / Treatments   ?Labs ?(all labs ordered are listed, but only abnormal results are displayed) ?Labs Reviewed  ?COMPREHENSIVE METABOLIC PANEL - Abnormal; Notable for the following components:  ?    Result Value  ? Glucose, Bld 103 (*)   ? All other components within normal limits  ?CBC - Abnormal; Notable for the following components:  ? RBC 5.20 (*)   ? Hemoglobin 15.6 (*)   ? HCT 46.3 (*)   ? All other components within normal limits  ?LIPASE, BLOOD  ?URINALYSIS, ROUTINE W REFLEX MICROSCOPIC  ?PREGNANCY, URINE  ? ? ?EKG ?None ? ?Radiology ?CT ABDOMEN PELVIS W CONTRAST ? ?Result Date: 12/16/2021 ?CLINICAL DATA:  Abdominal pain, acute, nonlocalized. Nausea vomiting. Distension. EXAM: CT ABDOMEN AND PELVIS WITH CONTRAST TECHNIQUE: Multidetector CT imaging of the abdomen and pelvis was performed using the standard protocol following bolus administration of intravenous contrast. RADIATION DOSE REDUCTION: This exam was performed according to the departmental dose-optimization program which includes automated exposure control, adjustment of the mA and/or kV according to patient size and/or use of iterative reconstruction technique. CONTRAST:  159m OMNIPAQUE IOHEXOL 300 MG/ML  SOLN COMPARISON:  None. FINDINGS: Lower chest: No acute abnormality. Hepatobiliary: No focal liver abnormality. Status post cholecystectomy. No biliary dilatation. Pancreas: No focal lesion. Normal pancreatic contour. No surrounding inflammatory changes. No main pancreatic ductal dilatation. Spleen: Normal in size without focal abnormality. Adrenals/Urinary Tract: No adrenal nodule bilaterally. Bilateral kidneys enhance symmetrically. No hydronephrosis. No hydroureter. The urinary bladder is unremarkable. On delayed imaging, there is no urothelial wall thickening and there are no filling  defects in the opacified portions of the bilateral collecting systems or ureters. Stomach/Bowel: Stomach is within normal limits. No evidence of bowel wall thickening or dilatation. Appendix appears normal. Vascular/Lymphatic: No abdominal aorta or iliac aneurysm. No abdominal, pelvic, or inguinal lymphadenopathy. Reproductive: There is a 5.3 cm left ovarian cystic lesion. Query left hydrosalpinx (5:47). Uterus and right adnexa are unremarkable. Other: No intraperitoneal free fluid. No intraperitoneal free gas. No organized fluid collection. Musculoskeletal: No abdominal wall hernia or abnormality. No suspicious lytic or blastic osseous lesions. No acute displaced fracture. IMPRESSION: 1. Query left hydrosalpinx. Recommend further evaluation pelvic ultrasound. 2. A 5.3 cm left ovarian cystic lesion. Because this lesion is not adequately characterized, prompt [place] recommended for further evaluation. Note: This recommendation does not apply to premenarchal patients and to those with increased risk (genetic, family history, elevated tumor markers or other high-risk factors) of ovarian cancer. Reference: JACR 2020 Feb; 17(2):248-254 3. Otherwise no acute intra-abdominal or intrapelvic abnormality. Electronically Signed   By: MThomasena Edis  Mckinley Jewel M.D.   On: 12/16/2021 15:20  ? ?US PELVIC COMPLETE W TRANSVAGINAL AND TORSION R/O ? ?Result Date: 12/16/2021 ?CLINICAL DATA:  Pelvic pain, follow-up CT scan EXAM: TRANSABDOMINAL AND TRANSVAGINAL ULTRASOUND OF PELVIS DOPPLER ULTRASOUND OF OVARIES TECHNIQUE: Both transabdominal and transvaginal ultrasound examinations of the pelvis were performed. Transabdominal technique was performed for global imaging of the pelvis including uterus, ovaries, adnexal regions, and pelvic cul-de-sac. It was necessary to proceed with endovaginal exam following the transabdominal exam to visualize the uterus, endometrium, and ovaries. Color and duplex Doppler ultrasound was utilized to evaluate blood  flow to the ovaries. COMPARISON:  Same day CT abdomen/pelvis FINDINGS: Uterus Measurements: 8.1 cm x 4.1 cm x 4.8 cm = volume: 83 mL. The uterus is heterogeneous in echotexture without focal abnormality. Endometrium Thic

## 2021-12-16 NOTE — ED Notes (Signed)
Delay in triage d/t registration ?

## 2021-12-18 ENCOUNTER — Telehealth: Payer: Self-pay

## 2021-12-18 NOTE — Telephone Encounter (Signed)
Received call from Ms. Thibeaux daughter Tricia Lucas. She is following up on her moms recent ER visit and referral. She is requesting to make an appointment with Dr. Berline Lopes. Ortencia Kick that our providers have reviewed the patients record and do not feel like she needs to be seen by an oncologist. They are recommending follow up with a gynecologist. She verbalized understanding. Provided her with the phone number for [facility] [phone].  ?

## 2022-01-16 ENCOUNTER — Encounter: Payer: Self-pay | Admitting: Obstetrics & Gynecology

## 2022-02-13 ENCOUNTER — Encounter: Payer: Self-pay | Admitting: Obstetrics & Gynecology

## 2022-02-13 ENCOUNTER — Ambulatory Visit (INDEPENDENT_AMBULATORY_CARE_PROVIDER_SITE_OTHER): Payer: 59 | Admitting: Obstetrics & Gynecology

## 2022-02-13 VITALS — BP 120/84 | HR 72 | Resp 16 | Wt 165.0 lb

## 2022-02-13 DIAGNOSIS — R1032 Left lower quadrant pain: Secondary | ICD-10-CM | POA: Diagnosis not present

## 2022-02-13 DIAGNOSIS — N83202 Unspecified ovarian cyst, left side: Secondary | ICD-10-CM | POA: Diagnosis not present

## 2022-02-13 LAB — CBC
HCT: 39.6 % (ref 35.0–45.0)
Hemoglobin: 13.7 g/dL (ref 11.7–15.5)
MCH: 30.6 pg (ref 27.0–33.0)
MCHC: 34.6 g/dL (ref 32.0–36.0)
MCV: 88.4 fL (ref 80.0–100.0)
MPV: 9.6 fL (ref 7.5–12.5)
Platelets: 276 10*3/uL (ref 140–400)
RBC: 4.48 10*6/uL (ref 3.80–5.10)
RDW: 12.9 % (ref 11.0–15.0)
WBC: 8.8 10*3/uL (ref 3.8–10.8)

## 2022-02-13 MED ORDER — NORETHINDRONE 0.35 MG PO TABS: 1.0000 | ORAL_TABLET | Freq: Every day | ORAL | 4 refills | Status: DC

## 2022-02-13 NOTE — Progress Notes (Unsigned)
    Tricia Lucas Nov 18, 1970 [id number]        51 y.o.  G2P2L2 Married.  S/P TL.  RP: Left Ovarian Cyst  HPI:  Irregular menses with variable flow.  Left pelvic pain intermittently and dyspareunia.  CT Scan and Pelvic US in 12/2021.   OB History  Gravida Para Term Preterm AB Living  2 2 0     2  SAB IAB Ectopic Multiple Live Births               # Outcome Date GA Lbr Len/2nd Weight Sex Delivery Anes PTL Lv  2 Para           1 Para             Past medical history,surgical history, problem list, medications, allergies, family history and social history were all reviewed and documented in the EPIC chart.   Directed ROS with pertinent positives and negatives documented in the history of present illness/assessment and plan.  Exam:  Vitals:   02/13/22 1532  BP: 120/84  Pulse: 72  Resp: 16  Weight: 165 lb (74.8 kg)   General appearance:  Normal  Abdomen: Soft, NT  Gynecologic exam: Vulva normal.    CT Scan:  Pelvic US 12/16/2021:  Left ovarian cyst 5.3 cm simple.   Assessment/Plan:  51 y.o. G2P0002   1. Left ovarian cyst  - US Transvaginal Non-OB; Future  2. Intermittent left lower quadrant abdominal pain  - US Transvaginal Non-OB; Future - CBC  Other orders - IBUPROFEN PO; Take by mouth. - norethindrone (MICRONOR) 0.35 MG tablet; Take 1 tablet (0.35 mg total) by mouth daily.   Princess Bruins MD, 3:56 PM 02/13/2022

## 2022-02-15 ENCOUNTER — Encounter: Payer: Self-pay | Admitting: Obstetrics & Gynecology

## 2022-03-10 DIAGNOSIS — U071 COVID-19: Secondary | ICD-10-CM

## 2022-03-10 HISTORY — DX: COVID-19: U07.1

## 2022-03-16 ENCOUNTER — Ambulatory Visit (INDEPENDENT_AMBULATORY_CARE_PROVIDER_SITE_OTHER): Payer: 59 | Admitting: Obstetrics & Gynecology

## 2022-03-16 ENCOUNTER — Encounter: Payer: Self-pay | Admitting: Obstetrics & Gynecology

## 2022-03-16 VITALS — BP 116/82 | HR 79

## 2022-03-16 DIAGNOSIS — N945 Secondary dysmenorrhea: Secondary | ICD-10-CM | POA: Diagnosis not present

## 2022-03-16 DIAGNOSIS — N840 Polyp of corpus uteri: Secondary | ICD-10-CM | POA: Diagnosis not present

## 2022-03-16 DIAGNOSIS — N83299 Other ovarian cyst, unspecified side: Secondary | ICD-10-CM

## 2022-03-16 DIAGNOSIS — N920 Excessive and frequent menstruation with regular cycle: Secondary | ICD-10-CM

## 2022-03-16 LAB — CBC
HCT: 41.7 % (ref 35.0–45.0)
Hemoglobin: 14.2 g/dL (ref 11.7–15.5)
MCH: 30 pg (ref 27.0–33.0)
MCHC: 34.1 g/dL (ref 32.0–36.0)
MCV: 88.2 fL (ref 80.0–100.0)
MPV: 9.9 fL (ref 7.5–12.5)
Platelets: 248 10*3/uL (ref 140–400)
RBC: 4.73 10*6/uL (ref 3.80–5.10)
RDW: 12.5 % (ref 11.0–15.0)
WBC: 6.9 10*3/uL (ref 3.8–10.8)

## 2022-03-16 NOTE — Progress Notes (Signed)
   Tricia Lucas 12/18/1970 [id number]        51 y.o.  G2P2L2 Accompanied by husband  RP: Counseling and management of Menorrhagia/Endometrial Polyp/Ovarian Cysts  HPI: Started on the Progestin only BCPs early June 2023.  LMP 02/16/22 was very heavy with blood clots, severe cramps and dizziness.  The period lasted 10 days.  Patient presented to the ER.  A Pelvic US was done at [facility] on 02/24/2022.  Patient  has been better since that last heavy period.  Not currently bleeding, no pelvic pain.  H/O Endometrial Ablation.   OB History  Gravida Para Term Preterm AB Living  2 2 0     2  SAB IAB Ectopic Multiple Live Births               # Outcome Date GA Lbr Len/2nd Weight Sex Delivery Anes PTL Lv  2 Para           1 Para             Past medical history,surgical history, problem list, medications, allergies, family history and social history were all reviewed and documented in the EPIC chart.   Directed ROS with pertinent positives and negatives documented in the history of present illness/assessment and plan.  Exam:  Vitals:   03/16/22 1340  BP: 116/82  Pulse: 79  SpO2: 95%   General appearance:  Normal  Gynecologic exam: Deferred  Pelvic US 02/24/22: The uterus is retroverted and measures 6.9 x 5.2 x 5.1 cm. There is a small amount of fluid in the endometrial cavity and the endometrial stripe approximates 4 mm. There is a focal echogenicity in the fundal endometrial cavity measuring 10 x 8 x 9 mm suggesting a potential endometrial polyp. Right ovary measured 2.6 x 1.5 x 2.3 cm and was remarkable for a collapsing corpus luteal cyst measuring 1.5 cm. Left ovary measured 5.3 x 3 x 3.6 cm and was remarkable for multiple cysts. The largest cyst is minimally complex and measures 3.4 cm and there is a 1.7 cm complex cyst containing debris. Small amount of free fluid in the cul-de-sac.   Assessment/Plan:  51 y.o. G2P0002   1. Menorrhagia with regular cycle Started on the Progestin only  BCPs early June 2023.  LMP 02/16/22 was very heavy with blood clots, severe cramps and dizziness.  The period lasted 10 days.  Patient presented to the ER.  A Pelvic US was done at [facility] on 02/24/2022.  Patient  has been better since that last heavy period.  Not currently bleeding, no pelvic pain.  H/O Endometrial Ablation.  Pelvic US findings thoroughly reviewed with patient.  Probable Endometrial Polyp.  CBC today.  F/U Sonohysto.  Continue on the Progestin only Pill. - CBC - [place] Sonohysterogram; Future  2. Secondary dysmenorrhea Continue on the Progestin only pill and Ibuprofen as needed.  3. Endometrial polyp Probable fundal endometrial polyp 1 cm per Pelvic US.  Will f/u with a Sonohysto to further investigate. - [place] Sonohysterogram; Future  4. Functional ovarian cysts Small bilateral functional cysts.  Smaller c/w Pelvic US in 12/2021 on the Left Ovary.  Will continue on the Progestin only pill.  Other orders - diphenhydrAMINE HCl (BENADRYL PO); Take by mouth.   Counseling and management of menorrhagia, secondary dysmenorrhea, probable endometrial polyp and functional ovarian cysts, review of documentation, for 25 minutes.  Princess Bruins MD, 2:11 PM 03/16/2022

## 2022-04-02 ENCOUNTER — Other Ambulatory Visit: Payer: 59

## 2022-04-02 ENCOUNTER — Other Ambulatory Visit: Payer: 59 | Admitting: Obstetrics & Gynecology

## 2022-04-15 ENCOUNTER — Other Ambulatory Visit: Payer: 59

## 2022-04-15 ENCOUNTER — Other Ambulatory Visit: Payer: 59 | Admitting: Obstetrics & Gynecology

## 2022-05-07 ENCOUNTER — Other Ambulatory Visit: Payer: Self-pay | Admitting: Obstetrics & Gynecology

## 2022-05-07 ENCOUNTER — Ambulatory Visit: Payer: Commercial Managed Care - HMO | Admitting: Obstetrics & Gynecology

## 2022-05-07 ENCOUNTER — Ambulatory Visit: Payer: Commercial Managed Care - HMO

## 2022-05-07 ENCOUNTER — Other Ambulatory Visit (HOSPITAL_COMMUNITY)
Admission: RE | Admit: 2022-05-07 | Discharge: 2022-05-07 | Disposition: A | Discharge status: Home / Self Care | Payer: 59 | Source: Ambulatory Visit | Attending: Obstetrics & Gynecology | Admitting: Obstetrics & Gynecology

## 2022-05-07 ENCOUNTER — Encounter: Payer: Self-pay | Admitting: Obstetrics & Gynecology

## 2022-05-07 VITALS — BP 130/88 | HR 110 | Wt 165.0 lb

## 2022-05-07 DIAGNOSIS — N840 Polyp of corpus uteri: Secondary | ICD-10-CM

## 2022-05-07 DIAGNOSIS — N9489 Other specified conditions associated with female genital organs and menstrual cycle: Secondary | ICD-10-CM | POA: Diagnosis not present

## 2022-05-07 DIAGNOSIS — N920 Excessive and frequent menstruation with regular cycle: Secondary | ICD-10-CM

## 2022-05-07 DIAGNOSIS — Z9889 Other specified postprocedural states: Secondary | ICD-10-CM

## 2022-05-07 DIAGNOSIS — N83202 Unspecified ovarian cyst, left side: Secondary | ICD-10-CM

## 2022-05-07 HISTORY — PX: ENDOMETRIAL BIOPSY: SHX622

## 2022-05-07 MED ORDER — IBUPROFEN 200 MG PO TABS
800.0000 mg | ORAL_TABLET | Freq: Once | ORAL | Status: AC
Start: 2022-05-07 — End: 2022-05-07
  Administered 2022-05-07: 800 mg via ORAL

## 2022-05-07 NOTE — Progress Notes (Signed)
Tricia Lucas 09/27/1970 [id number]        51 y.o.  G2P2L2   RP: Menorrhagia/Thickened endometrium for Sonohysto/EBx  HPI: No change x visit on 03/16/22:  Started on the Progestin only BCPs early June 2023.  Menses still very heavy with blood clots, severe cramps and dizziness.  The periods last up to 10 days. Patient presented to the ER at [facility] in 02/2022. No pelvic pain.  H/O Endometrial Ablation.  Pelvic US 02/24/22: The uterus is retroverted and measures 6.9 x 5.2 x 5.1 cm. There is a small amount of fluid in the endometrial cavity and the endometrial stripe approximates 4 mm. There is a focal echogenicity in the fundal endometrial cavity measuring 10 x 8 x 9 mm suggesting a potential endometrial polyp. Right ovary measured 2.6 x 1.5 x 2.3 cm and was remarkable for a collapsing corpus luteal cyst measuring 1.5 cm. Left ovary measured 5.3 x 3 x 3.6 cm and was remarkable for multiple cysts. The largest cyst is minimally complex and measures 3.4 cm and there is a 1.7 cm complex cyst containing debris. Small amount of free fluid in the cul-de-sac.  OB History  Gravida Para Term Preterm AB Living  2 2 0     2  SAB IAB Ectopic Multiple Live Births               # Outcome Date GA Lbr Len/2nd Weight Sex Delivery Anes PTL Lv  2 Para           1 Para             Past medical history,surgical history, problem list, medications, allergies, family history and social history were all reviewed and documented in the EPIC chart.   Directed ROS with pertinent positives and negatives documented in the history of present illness/assessment and plan.  Exam:  There were no vitals filed for this visit. General appearance:  Normal  Pelvic US today: T/V images.  Retroverted uterus normal in size and shape with small intramural fibroids noted.  The overall uterine size is measured at 8.08 x 4.86 x 4.16 cm.  Heterogeneous asymmetrical endometrium status post endometrial ablation measured at 7.66 mm with  echogenic focal area at the fundal portion of the canal with prominent feeder vessel measuring 0.9 x 0.5 cm, probable endometrial polyp.  Trace amount of intracavitary fluid surrounding this area.  Sonohysterogram therefore not performed.  Right ovary is atrophic in appearance within normal limits with positive perfusion to the ovary.  Left ovary is enlarged with an avascular complex cyst with low-level echoes and debris's measuring 3.8 x 3.1 x 3.9 cm, hemorrhagic in appearance, a smaller minimally complex avascular cyst measuring 1.7 x 1.7 cm is also present.  Positive perfusion to the left ovary.  No adnexal mass.  Trace amount of free fluid adjacent to the left ovary.  Endometrial biopsy.  Informed written consent obtained.  Vulva normal.  Speculum inserted in the vagina.  Hibiclens prep of the cervix.  Hurricane spray.  Tenaculum on the anterior lip of the cervix.  Os finder.  Endometrial biopsy with the pipette using negative pressure on all IU surfaces.  Specimen sent to pathology.  Tenaculum removed.  Silver Nitrate for hemostasis at site of tenaculum.  Speculum removed.  Well tolerated.  No Cx.  Post procedure precautions.   Assessment/Plan:  51 y.o. G2P0002   1. Menorrhagia with regular cycle No change x visit on 03/16/22:  Started on the Progestin only BCPs  early June 2023.  Menses still very heavy with blood clots, severe cramps and dizziness.  The periods last up to 10 days. Patient presented to the ER at [facility] in 02/2022. No pelvic pain.  H/O Endometrial Ablation.  Pelvic US today with small fibroids and a probable endometrial polyp.  S/P Endometrial ablation limiting the excision by [place].  Left Ovarian cyst of benign appearance just slightly larger than in 02/2022.  Will do Ca 125 today.  Patient requesting definitive surgery.  Decision to proceed with XI Robotic TLH/BS/possible Left Ovarian Cystectomy, possible Left Oophorectomy, peritoneal washings.  Pamphlet given.  F/U Preop visit.  2.  Endometrial mass Probable Endometrial Polyp.  EBx pending. - Surgical pathology( [place]/ [facility])  3. History of endometrial ablation Limiting access to the IU cavity.  4. Left ovarian cyst Left Ovarian cyst of benign appearance just slightly larger than in 02/2022.  Will do Ca 125 today.  - CA 125   Princess Bruins MD, 3:14 PM 05/07/2022

## 2022-05-08 ENCOUNTER — Telehealth: Payer: Self-pay | Admitting: *Deleted

## 2022-05-08 LAB — CA 125: CA 125: 11 U/mL (ref <35)

## 2022-05-08 NOTE — Telephone Encounter (Signed)
-----   Message from Princess Bruins, MD sent at 05/07/2022  5:14 PM EDT ----- Regarding: Schedule surgery Surgery: CPT [phone] - XI Robotic Total Laparoscopic Hysterectomy with Bilateral Salpingectomy, possible left ovarian cystectomy, possible left oophorectomy, peritoneal washings  Diagnosis: R93.89 Thickened Endometrium, N84.0 Endometrial Polyp, D25.9 Fibroids, Left ovarian cyst  Location: [place]  Status: Outpatient  Time: 38 Minutes  Assistant: Olivia Mackie RNFA  Urgency: First Available  Pre-Op Appointment: To Be Scheduled  Post-Op Appointment(s): 2 Weeks, 6 Weeks  Time Out Of Work: 3 Weeks

## 2022-05-08 NOTE — Telephone Encounter (Signed)
-----   Message from Princess Bruins, MD sent at 05/07/2022  5:14 PM EDT ----- Regarding: Schedule surgery Surgery: CPT [phone] - XI Robotic Total Laparoscopic Hysterectomy with Bilateral Salpingectomy, possible left ovarian cystectomy, possible left oophorectomy, peritoneal washings  Diagnosis: R93.89 Thickened Endometrium, N84.0 Endometrial Polyp, D25.9 Fibroids, Left ovarian cyst  Location: [place]  Status: Outpatient  Time: 8 Minutes  Assistant: Olivia Mackie RNFA  Urgency: First Available  Pre-Op Appointment: To Be Scheduled  Post-Op Appointment(s): 2 Weeks, 6 Weeks  Time Out Of Work: 3 Weeks

## 2022-05-12 LAB — SURGICAL PATHOLOGY

## 2022-05-14 NOTE — Telephone Encounter (Signed)
Spoke with patient. Patient declined interpreter for call. Reviewed surgery dates. Patient request to proceed with surgery on 07/08/22.  Advised patient I will forward to business office for return call. I will return call once surgery date and time confirmed. Patient verbalizes understanding and is agreeable.   Surgery request sent.   Patient request interpreter for return call for benefits and surgery instructions.   Routing to [employer]

## 2022-05-21 NOTE — Telephone Encounter (Signed)
Late entry:  Spoke with patient on 05/20/22. Patient request to reschedule surgery to later date. Surgery r/s to 11/28 at [facility]. Advised patient I will return call to review pre-op instructions once benefits reviewed. Patient verbalizes understanding.

## 2022-06-05 NOTE — Telephone Encounter (Signed)
Call to patient. Per DPR, OK to leave message on voicemail.   Left voicemail requesting a return call to review benefits for Scheduled Surgery with Princess Bruins, MD, [place], [place], [place].

## 2022-06-09 NOTE — Telephone Encounter (Signed)
Spoke with patient Educational psychologist ID # [id number]. Surgery date request confirmed.  Advised surgery is scheduled for 08/04/22, [facility] at 0830.  Spanish Surgery instruction sheet and hospital brochure reviewed, printed copy will be mailed.  Patient verbalizes understanding and is agreeable.   Routing to [employer] for benefits. Encounter may be closed once benefits reviewed.

## 2022-06-17 NOTE — Telephone Encounter (Signed)
Spoke with patient.  Advised patient of new surgery time, [facility] on 08/04/22 at 0730, arrive at 0530. Patient verbalizes understanding and is agreeable.

## 2022-06-25 ENCOUNTER — Encounter: Payer: Commercial Managed Care - HMO | Admitting: Obstetrics & Gynecology

## 2022-07-09 ENCOUNTER — Encounter: Payer: Self-pay | Admitting: Obstetrics & Gynecology

## 2022-07-09 ENCOUNTER — Ambulatory Visit (INDEPENDENT_AMBULATORY_CARE_PROVIDER_SITE_OTHER): Payer: Commercial Managed Care - HMO | Admitting: Obstetrics & Gynecology

## 2022-07-09 VITALS — BP 116/82 | HR 82 | Ht 62.25 in | Wt 163.0 lb

## 2022-07-09 DIAGNOSIS — N83202 Unspecified ovarian cyst, left side: Secondary | ICD-10-CM | POA: Diagnosis not present

## 2022-07-09 DIAGNOSIS — N9489 Other specified conditions associated with female genital organs and menstrual cycle: Secondary | ICD-10-CM

## 2022-07-09 DIAGNOSIS — N920 Excessive and frequent menstruation with regular cycle: Secondary | ICD-10-CM | POA: Diagnosis not present

## 2022-07-09 DIAGNOSIS — Z9889 Other specified postprocedural states: Secondary | ICD-10-CM

## 2022-07-09 NOTE — Progress Notes (Signed)
Tricia Lucas Dec 03, 1970 [id number]        51 y.o.  G2P2002   RP: Preop XI Robotic TLH/BS/possible Left Ovarian Cystectomy, possible Left Oophorectomy, peritoneal washings.   HPI: No change x visit on 05/07/22:  Started on the Progestin only BCPs early June 2023.  Menses still very heavy with blood clots, severe cramps and dizziness.  The periods last up to 10 days. Patient presented to the ER at [facility] in 02/2022. No pelvic pain.  H/O Endometrial Ablation.  Pelvic US 05/07/22 Small Fibroids/Endometrial Polyp/Left ovarian complex cysts.  Patient requesting definitive surgery.    OB History  Gravida Para Term Preterm AB Living  '2 2 2     2  '$ SAB IAB Ectopic Multiple Live Births               # Outcome Date GA Lbr Len/2nd Weight Sex Delivery Anes PTL Lv  2 Term           1 Term             Past medical history,surgical history, problem list, medications, allergies, family history and social history were all reviewed and documented in the EPIC chart.   Directed ROS with pertinent positives and negatives documented in the history of present illness/assessment and plan.  Exam:  Vitals:   07/09/22 1616  BP: 116/82  Pulse: 82  SpO2: 99%  Weight: 163 lb (73.9 kg)  Height: 5' 2.25" (1.581 m)   General appearance:  Normal  Pelvic US 05/07/22:  T/V images.  Retroverted uterus normal in size and shape with small intramural fibroids noted.  The overall uterine size is measured at 8.08 x 4.86 x 4.16 cm.  Heterogeneous asymmetrical endometrium status post endometrial ablation measured at 7.66 mm with echogenic focal area at the fundal portion of the canal with prominent feeder vessel measuring 0.9 x 0.5 cm, probable endometrial polyp.  Trace amount of intracavitary fluid surrounding this area.  Sonohysterogram therefore not performed.  Right ovary is atrophic in appearance within normal limits with positive perfusion to the ovary.  Left ovary is enlarged with an avascular complex cyst with  low-level echoes and debris's measuring 3.8 x 3.1 x 3.9 cm, hemorrhagic in appearance, a smaller minimally complex avascular cyst measuring 1.7 x 1.7 cm is also present.  Positive perfusion to the left ovary.  No adnexal mass.  Trace amount of free fluid adjacent to the left ovary.   Endometrial biopsy 05/07/22:FINAL MICROSCOPIC DIAGNOSIS:   A. ENDOMETRIUM, BIOPSY:  Superficial fragments of benign proliferative endometrium  Benign endocervical epithelium  Negative for breakdown, polyp, atypia, hyperplasia and carcinoma   Ca 125 normal at 11 on 05/07/22   Assessment/Plan:  51 y.o. G2P0002    1. Menorrhagia with regular cycle No change x visit on 05/07/22:  Started on the Progestin only BCPs early June 2023.  H/O Endometrial Ablation.  Pelvic US today with small fibroids/endometrial polyp/Lt complex ovarian cysts. Ca 125 normal at 11.  EBx Benign.  Patient requesting definitive surgery.  Decision to proceed with XI Robotic TLH/BSO, peritoneal washings.  Benefits/risks of BSO thoroughly reviewed with patient.  Decision made to remove the ovaries.  Preop preparation, surgery and risks, postop expectations and precautions discussed.  Patient voiced understanding and agreement with the plan.   2. Endometrial mass Probable Endometrial Polyp.  EBx benign.  3. History of endometrial ablation Limiting access to the IU cavity.   4. Left ovarian cyst Left complex ovarian cyst.  Ca 125  normal at 11 on 05/07/22.  Counseling on benefits vs risks of removing the ovaries or preserving them.  Patient decided to proceed with BSO with the TLH.                         Patient was counseled as to the risk of surgery to include the following:  1. Infection (prohylactic antibiotics will be administered)  2. DVT/Pulmonary Embolism (prophylactic pneumo compression stockings will be used)  3.Trauma to internal organs requiring additional surgical procedure to repair any injury to internal organs requiring perhaps  additional hospitalization days.  4.Hemmorhage requiring transfusion and blood products which carry risks such as     anaphylactic reaction, hepatitis and AIDS  Patient had received literature information on the procedure scheduled and all her questions were answered and fully accepts all risk.   Princess Bruins MD, 4:33 PM 07/09/2022

## 2022-07-13 ENCOUNTER — Telehealth: Payer: Self-pay | Admitting: *Deleted

## 2022-07-13 NOTE — Telephone Encounter (Signed)
-----   Message from Princess Bruins, MD sent at 07/09/2022  5:06 PM EDT ----- Regarding: Change in planned surgery Please change the planned surgery to:  XI Robotic Total Laparoscopic Hysterectomy with BILATERAL SALPINGO-OOPHORECTOMY, peritoneal washings.

## 2022-07-14 NOTE — Telephone Encounter (Signed)
Spoke with Colletta Maryland in [place] scheduling, case updated.   No change to CPT code.   Routing to Dr. Ileene Musa  Encounter closed.

## 2022-07-14 NOTE — Telephone Encounter (Signed)
Also see telephone encounter dated 07/13/22 for updated surgery.   Routing to provider for final review. Patient is agreeable to disposition. Will close encounter.

## 2022-07-21 ENCOUNTER — Encounter (HOSPITAL_BASED_OUTPATIENT_CLINIC_OR_DEPARTMENT_OTHER): Payer: Self-pay | Admitting: Obstetrics & Gynecology

## 2022-07-24 ENCOUNTER — Other Ambulatory Visit: Payer: Self-pay

## 2022-07-24 ENCOUNTER — Encounter (HOSPITAL_BASED_OUTPATIENT_CLINIC_OR_DEPARTMENT_OTHER): Payer: Self-pay | Admitting: Obstetrics & Gynecology

## 2022-07-24 DIAGNOSIS — Z01812 Encounter for preprocedural laboratory examination: Secondary | ICD-10-CM | POA: Diagnosis present

## 2022-07-24 NOTE — Progress Notes (Addendum)
Spoke w/ via phone for pre-op interview---Dajane via [place] ID # [id number] Lab needs dos---- urine pregnancy              Lab results------07/29/22 lab appt for cbc, type & screen, 12/16/21 EKG in chart & Epic COVID test -----patient states asymptomatic no test needed Arrive at -------0530 on Tuesday, 08/04/22 NPO after MN NO Solid Food.  Clear liquids from MN until---0430 Med rec completed Medications to take morning of surgery -----Flonase prn Diabetic medication ----n/a- Patient instructed no nail polish to be worn day of surgery Patient instructed to bring photo id and insurance card day of surgery Patient aware to have Driver (ride ) / caregiver    for 24 hours after surgery - husband [facility] Patient Special Instructions -----Extended / overnight stay instructions given in English. Patient stated that she had someone to read the instructions to her. Pre-Op special Istructions -----none Patient verbalized understanding of instructions that were given at this phone interview. Patient denies shortness of breath, chest pain, fever, cough at this phone interview.   Spanish (female) interpreter requested for day of surgery.

## 2022-07-24 NOTE — Progress Notes (Signed)
Your procedure is scheduled on Tuesday, 08/04/2022.  Report to [facility].   Call this number if you have problems the morning of surgery  :[phone].   OUR ADDRESS IS [address] [place].  WE ARE LOCATED IN [facility].  PLEASE BRING YOUR INSURANCE CARD AND PHOTO ID DAY OF SURGERY.  ONLY 2 PEOPLE ARE ALLOWED IN  WAITING  ROOM.                                      REMEMBER:  DO NOT EAT FOOD, CANDY GUM OR MINTS  AFTER MIDNIGHT THE NIGHT BEFORE YOUR SURGERY . YOU MAY HAVE CLEAR LIQUIDS FROM MIDNIGHT THE NIGHT BEFORE YOUR SURGERY UNTIL  4:30 AM. NO CLEAR LIQUIDS AFTER   4:30 AM DAY OF SURGERY.  YOU MAY  BRUSH YOUR TEETH MORNING OF SURGERY AND RINSE YOUR MOUTH OUT, NO CHEWING GUM CANDY OR MINTS.     CLEAR LIQUID DIET   Foods Allowed                                                                     Foods Excluded  Coffee and tea, regular and decaf                             liquids that you cannot  Plain Jell-O                                                                   see through such as: Fruit ices (not with fruit pulp)                                     milk, soups, orange juice  Plain  Popsicles                                    All solid food Carbonated beverages, regular and diet                                    Cranberry, grape and apple juices Sports drinks like Gatorade _____________________________________________________________________     TAKE ONLY THESE MEDICATIONS MORNING OF SURGERY: Flonase if needed    UP TO 4 VISITORS  MAY VISIT IN THE EXTENDED RECOVERY ROOM UNTIL 800 PM ONLY.  ONE  VISITOR AGE 51 AND OVER MAY SPEND THE NIGHT AND MUST BE IN EXTENDED RECOVERY ROOM NO LATER THAN 800 PM . YOUR DISCHARGE TIME AFTER YOU SPEND THE NIGHT IS 900 AM THE MORNING AFTER YOUR SURGERY.  YOU MAY PACK A SMALL OVERNIGHT BAG WITH TOILETRIES FOR YOUR  OVERNIGHT STAY IF YOU WISH.  YOUR PRESCRIPTION MEDICATIONS  WILL BE PROVIDED DURING [place].                                      DO NOT WEAR JEWERLY, MAKE UP. DO NOT WEAR LOTIONS, POWDERS, PERFUMES OR NAIL POLISH ON YOUR FINGERNAILS. TOENAIL POLISH IS OK TO WEAR. DO NOT SHAVE FOR 48 HOURS PRIOR TO DAY OF SURGERY. MEN MAY SHAVE FACE AND NECK. CONTACTS, GLASSES, OR DENTURES MAY NOT BE WORN TO SURGERY.  REMEMBER: NO SMOKING, DRUGS OR ALCOHOL FOR 24 HOURS BEFORE YOUR SURGERY.                                    [facility] IS NOT RESPONSIBLE  FOR ANY BELONGINGS.                                                                    Marland Kitchen           [facility] - Preparing for Surgery Before surgery, you can play an important role.  Because skin is not sterile, your skin needs to be as free of germs as possible.  You can reduce the number of germs on your skin by washing with CHG (chlorahexidine gluconate) soap before surgery.  CHG is an antiseptic cleaner which kills germs and bonds with the skin to continue killing germs even after washing. Please DO NOT use if you have an allergy to CHG or antibacterial soaps.  If your skin becomes reddened/irritated stop using the CHG and inform your nurse when you arrive at Short Stay. Do not shave (including legs and underarms) for at least 48 hours prior to the first CHG shower.  You may shave your face/neck. Please follow these instructions carefully:  1.  Shower with CHG Soap the night before surgery and the  morning of Surgery.  2.  If you choose to wash your hair, wash your hair first as usual with your  normal  shampoo.  3.  After you shampoo, rinse your hair and body thoroughly to remove the  shampoo.                                        4.  Use CHG as you would any other liquid soap.  You can apply chg directly  to the skin and wash , chg soap provided, night before and morning of your surgery.  5.  Apply the CHG Soap to your body ONLY FROM THE NECK DOWN.   Do not use on face/ open                            Wound or open sores. Avoid contact with eyes, ears mouth and genitals (private parts).                       Wash face,  Genitals (private parts) with your normal soap.  6.  Wash thoroughly, paying special attention to the area where your surgery  will be performed.  7.  Thoroughly rinse your body with warm water from the neck down.  8.  DO NOT shower/wash with your normal soap after using and rinsing off  the CHG Soap.             9.  Pat yourself dry with a clean towel.            10.  Wear clean pajamas.            11.  Place clean sheets on your bed the night of your first shower and do not  sleep with pets. Day of Surgery : Do not apply any lotions/deodorants the morning of surgery.  Please wear clean clothes to the hospital/surgery center.  IF YOU HAVE ANY SKIN IRRITATION OR PROBLEMS WITH THE SURGICAL SOAP, PLEASE GET A BAR OF GOLD DIAL SOAP AND SHOWER THE NIGHT BEFORE YOUR SURGERY AND THE MORNING OF YOUR SURGERY. PLEASE LET THE NURSE KNOW MORNING OF YOUR SURGERY IF YOU HAD ANY PROBLEMS WITH THE SURGICAL SOAP.   ________________________________________________________________________                                                        QUESTIONS [place] PRE OP NURSE PHONE [phone].

## 2022-07-29 ENCOUNTER — Encounter (HOSPITAL_COMMUNITY)
Admission: RE | Admit: 2022-07-29 | Discharge: 2022-07-29 | Disposition: A | Discharge status: Home / Self Care | Payer: Commercial Managed Care - HMO | Source: Ambulatory Visit | Attending: Obstetrics & Gynecology | Admitting: Obstetrics & Gynecology

## 2022-07-29 DIAGNOSIS — Z01812 Encounter for preprocedural laboratory examination: Secondary | ICD-10-CM | POA: Insufficient documentation

## 2022-07-29 DIAGNOSIS — Z01818 Encounter for other preprocedural examination: Secondary | ICD-10-CM

## 2022-07-29 LAB — CBC
HCT: 43.3 % (ref 36.0–46.0)
Hemoglobin: 14.2 g/dL (ref 12.0–15.0)
MCH: 30.7 pg (ref 26.0–34.0)
MCHC: 32.8 g/dL (ref 30.0–36.0)
MCV: 93.7 fL (ref 80.0–100.0)
Platelets: 284 K/uL (ref 150–400)
RBC: 4.62 MIL/uL (ref 3.87–5.11)
RDW: 12.9 % (ref 11.5–15.5)
WBC: 7 K/uL (ref 4.0–10.5)
nRBC: 0 % (ref 0.0–0.2)

## 2022-08-03 NOTE — Anesthesia Preprocedure Evaluation (Signed)
Anesthesia Evaluation  Patient identified by MRN, date of birth, ID band Patient awake    Reviewed: Allergy & Precautions, NPO status , Patient's Chart, lab work & pertinent test results  Airway Mallampati: II  TM Distance: >3 FB Neck ROM: Full    Dental no notable dental hx. (+) Implants, Dental Advisory Given   Pulmonary    Pulmonary exam normal breath sounds clear to auscultation       Cardiovascular Exercise Tolerance: Good Normal cardiovascular exam Rhythm:Regular Rate:Normal     Neuro/Psych  Headaches  Neuromuscular disease    GI/Hepatic negative GI ROS, Neg liver ROS,,,  Endo/Other  negative endocrine ROS    Renal/GU          Musculoskeletal negative musculoskeletal ROS (+)    Abdominal   Peds  Hematology Lab Results      Component                Value               Date                      WBC                      7.0                 07/29/2022                HGB                      14.2                07/29/2022                HCT                      43.3                07/29/2022                     PLT                      284                 07/29/2022              Anesthesia Other Findings All: Gabapentin  Reproductive/Obstetrics negative OB ROS                             Anesthesia Physical Anesthesia Plan  ASA: 2  Anesthesia Plan: General   Post-op Pain Management: Lidocaine infusion*, Ketamine IV*, Toradol IV (intra-op)* and Ofirmev IV (intra-op)*   Induction: Intravenous  PONV Risk Score and Plan: 4 or greater and Treatment may vary due to age or medical condition, Midazolam, Ondansetron and Dexamethasone  Airway Management Planned: Oral ETT  Additional Equipment: None  Intra-op Plan:   Post-operative Plan:   Informed Consent: I have reviewed the patients History and Physical, chart, labs and discussed the procedure including the risks, benefits and  alternatives for the proposed anesthesia with the patient or authorized representative who has indicated his/her understanding and acceptance.     Dental advisory given and Interpreter used for interveiw  Plan Discussed with:   Anesthesia Plan Comments: (interpreter Spanish)  Anesthesia Quick Evaluation  

## 2022-08-04 ENCOUNTER — Ambulatory Visit (HOSPITAL_BASED_OUTPATIENT_CLINIC_OR_DEPARTMENT_OTHER)
Admission: RE | Admit: 2022-08-04 | Discharge: 2022-08-04 | Disposition: A | Discharge status: Home / Self Care | Payer: Commercial Managed Care - HMO | Attending: Obstetrics & Gynecology | Admitting: Obstetrics & Gynecology

## 2022-08-04 ENCOUNTER — Encounter (HOSPITAL_BASED_OUTPATIENT_CLINIC_OR_DEPARTMENT_OTHER)
Admission: RE | Disposition: A | Discharge status: Home / Self Care | Payer: Self-pay | Source: Home / Self Care | Attending: Obstetrics & Gynecology

## 2022-08-04 ENCOUNTER — Other Ambulatory Visit: Payer: Self-pay

## 2022-08-04 ENCOUNTER — Ambulatory Visit (HOSPITAL_BASED_OUTPATIENT_CLINIC_OR_DEPARTMENT_OTHER): Payer: Commercial Managed Care - HMO | Admitting: Certified Registered Nurse Anesthetist

## 2022-08-04 ENCOUNTER — Encounter (HOSPITAL_BASED_OUTPATIENT_CLINIC_OR_DEPARTMENT_OTHER): Payer: Self-pay | Admitting: Obstetrics & Gynecology

## 2022-08-04 DIAGNOSIS — R9389 Abnormal findings on diagnostic imaging of other specified body structures: Secondary | ICD-10-CM | POA: Insufficient documentation

## 2022-08-04 DIAGNOSIS — N736 Female pelvic peritoneal adhesions (postinfective): Secondary | ICD-10-CM

## 2022-08-04 DIAGNOSIS — Z9889 Other specified postprocedural states: Secondary | ICD-10-CM

## 2022-08-04 DIAGNOSIS — N83202 Unspecified ovarian cyst, left side: Secondary | ICD-10-CM | POA: Insufficient documentation

## 2022-08-04 DIAGNOSIS — D259 Leiomyoma of uterus, unspecified: Secondary | ICD-10-CM | POA: Insufficient documentation

## 2022-08-04 DIAGNOSIS — N803 Endometriosis of pelvic peritoneum, unspecified: Secondary | ICD-10-CM | POA: Diagnosis not present

## 2022-08-04 DIAGNOSIS — Z01818 Encounter for other preprocedural examination: Secondary | ICD-10-CM

## 2022-08-04 DIAGNOSIS — N840 Polyp of corpus uteri: Secondary | ICD-10-CM | POA: Diagnosis not present

## 2022-08-04 HISTORY — DX: Presence of spectacles and contact lenses: Z97.3

## 2022-08-04 HISTORY — PX: ROBOTIC ASSISTED TOTAL HYSTERECTOMY WITH BILATERAL SALPINGO OOPHERECTOMY: SHX6086

## 2022-08-04 HISTORY — DX: Pain in right shoulder: M25.511

## 2022-08-04 HISTORY — DX: Prediabetes: R73.03

## 2022-08-04 HISTORY — DX: Hyperlipidemia, unspecified: E78.5

## 2022-08-04 LAB — CBC
HCT: 40.1 % (ref 36.0–46.0)
Hemoglobin: 13.1 g/dL (ref 12.0–15.0)
MCH: 30.6 pg (ref 26.0–34.0)
MCHC: 32.7 g/dL (ref 30.0–36.0)
MCV: 93.7 fL (ref 80.0–100.0)
Platelets: 260 10*3/uL (ref 150–400)
RBC: 4.28 MIL/uL (ref 3.87–5.11)
RDW: 12.7 % (ref 11.5–15.5)
WBC: 10.9 10*3/uL — ABNORMAL HIGH (ref 4.0–10.5)
nRBC: 0 % (ref 0.0–0.2)

## 2022-08-04 LAB — TYPE AND SCREEN
ABO/RH(D): O POS
Antibody Screen: NEGATIVE

## 2022-08-04 LAB — POCT PREGNANCY, URINE: Preg Test, Ur: NEGATIVE

## 2022-08-04 LAB — ABO/RH: ABO/RH(D): O POS

## 2022-08-04 SURGERY — HYSTERECTOMY, TOTAL, ROBOT-ASSISTED, LAPAROSCOPIC, WITH BILATERAL SALPINGO-OOPHORECTOMY
Anesthesia: General | Site: Abdomen

## 2022-08-04 MED ORDER — PHENYLEPHRINE 80 MCG/ML (10ML) SYRINGE FOR IV PUSH (FOR BLOOD PRESSURE SUPPORT)
PREFILLED_SYRINGE | INTRAVENOUS | Status: AC
  Filled 2022-08-04: qty 10

## 2022-08-04 MED ORDER — SCOPOLAMINE 1 MG/3DAYS TD PT72
1.0000 | MEDICATED_PATCH | TRANSDERMAL | Status: DC
  Administered 2022-08-04: 1.5 mg via TRANSDERMAL

## 2022-08-04 MED ORDER — LIDOCAINE HCL (PF) 2 % IJ SOLN
INTRAMUSCULAR | Status: AC
  Filled 2022-08-04: qty 5

## 2022-08-04 MED ORDER — OXYCODONE HCL 5 MG PO TABS
5.0000 mg | ORAL_TABLET | Freq: Once | ORAL | Status: DC | PRN
  Administered 2022-08-04: 5 mg via ORAL

## 2022-08-04 MED ORDER — LACTATED RINGERS IV SOLN: INTRAVENOUS | Status: DC

## 2022-08-04 MED ORDER — ACETAMINOPHEN 10 MG/ML IV SOLN
INTRAVENOUS | Status: DC | PRN
  Administered 2022-08-04: 1000 mg via INTRAVENOUS

## 2022-08-04 MED ORDER — PROPOFOL 10 MG/ML IV BOLUS
INTRAVENOUS | Status: AC
  Filled 2022-08-04: qty 20

## 2022-08-04 MED ORDER — HYDROCODONE-ACETAMINOPHEN 5-325 MG PO TABS: 1.0000 | ORAL_TABLET | ORAL | Status: DC | PRN

## 2022-08-04 MED ORDER — HYDROMORPHONE HCL 2 MG/ML IJ SOLN
INTRAMUSCULAR | Status: AC
  Filled 2022-08-04: qty 1

## 2022-08-04 MED ORDER — ONDANSETRON HCL 4 MG/2ML IJ SOLN
INTRAMUSCULAR | Status: DC | PRN
  Administered 2022-08-04: 4 mg via INTRAVENOUS

## 2022-08-04 MED ORDER — OXYCODONE HCL 5 MG/5ML PO SOLN: 5.0000 mg | Freq: Once | ORAL | Status: DC | PRN

## 2022-08-04 MED ORDER — SUGAMMADEX SODIUM 200 MG/2ML IV SOLN
INTRAVENOUS | Status: DC | PRN
  Administered 2022-08-04: 200 mg via INTRAVENOUS

## 2022-08-04 MED ORDER — OXYCODONE-ACETAMINOPHEN 7.5-325 MG PO TABS: 1.0000 | ORAL_TABLET | Freq: Four times a day (QID) | ORAL | 0 refills | Status: DC | PRN

## 2022-08-04 MED ORDER — ROCURONIUM BROMIDE 10 MG/ML (PF) SYRINGE
PREFILLED_SYRINGE | INTRAVENOUS | Status: AC
  Filled 2022-08-04: qty 10

## 2022-08-04 MED ORDER — POVIDONE-IODINE 10 % EX SWAB: 2.0000 | Freq: Once | CUTANEOUS | Status: DC

## 2022-08-04 MED ORDER — ACETAMINOPHEN 10 MG/ML IV SOLN
INTRAVENOUS | Status: AC
  Filled 2022-08-04: qty 100

## 2022-08-04 MED ORDER — ACETAMINOPHEN 325 MG PO TABS: 650.0000 mg | ORAL_TABLET | ORAL | Status: DC | PRN

## 2022-08-04 MED ORDER — PHENYLEPHRINE HCL (PRESSORS) 10 MG/ML IV SOLN
INTRAVENOUS | Status: DC | PRN
  Administered 2022-08-04 (×3): 80 ug via INTRAVENOUS

## 2022-08-04 MED ORDER — ONDANSETRON HCL 4 MG/2ML IJ SOLN
INTRAMUSCULAR | Status: AC
  Filled 2022-08-04: qty 2

## 2022-08-04 MED ORDER — ONDANSETRON HCL 4 MG/2ML IJ SOLN: 4.0000 mg | Freq: Once | INTRAMUSCULAR | Status: DC | PRN

## 2022-08-04 MED ORDER — LIDOCAINE HCL (CARDIAC) PF 100 MG/5ML IV SOSY
PREFILLED_SYRINGE | INTRAVENOUS | Status: DC | PRN
  Administered 2022-08-04: 100 mg via INTRAVENOUS

## 2022-08-04 MED ORDER — OXYCODONE HCL 5 MG PO TABS
ORAL_TABLET | ORAL | Status: AC
  Filled 2022-08-04: qty 1

## 2022-08-04 MED ORDER — FENTANYL CITRATE (PF) 100 MCG/2ML IJ SOLN
INTRAMUSCULAR | Status: DC | PRN
  Administered 2022-08-04: 100 ug via INTRAVENOUS
  Administered 2022-08-04: 50 ug via INTRAVENOUS
  Administered 2022-08-04: 100 ug via INTRAVENOUS

## 2022-08-04 MED ORDER — HYDROMORPHONE HCL 1 MG/ML IJ SOLN
INTRAMUSCULAR | Status: AC
  Filled 2022-08-04: qty 1

## 2022-08-04 MED ORDER — ROCURONIUM BROMIDE 100 MG/10ML IV SOLN
INTRAVENOUS | Status: DC | PRN
  Administered 2022-08-04 (×2): 20 mg via INTRAVENOUS
  Administered 2022-08-04: 50 mg via INTRAVENOUS

## 2022-08-04 MED ORDER — EPHEDRINE 5 MG/ML INJ
INTRAVENOUS | Status: AC
  Filled 2022-08-04: qty 5

## 2022-08-04 MED ORDER — PROPOFOL 10 MG/ML IV BOLUS
INTRAVENOUS | Status: DC | PRN
  Administered 2022-08-04: 200 mg via INTRAVENOUS

## 2022-08-04 MED ORDER — FENTANYL CITRATE (PF) 250 MCG/5ML IJ SOLN
INTRAMUSCULAR | Status: AC
  Filled 2022-08-04: qty 5

## 2022-08-04 MED ORDER — SCOPOLAMINE 1 MG/3DAYS TD PT72
MEDICATED_PATCH | TRANSDERMAL | Status: AC
  Filled 2022-08-04: qty 1

## 2022-08-04 MED ORDER — MIDAZOLAM HCL 5 MG/5ML IJ SOLN
INTRAMUSCULAR | Status: DC | PRN
  Administered 2022-08-04: 2 mg via INTRAVENOUS

## 2022-08-04 MED ORDER — KETAMINE HCL 50 MG/5ML IJ SOSY
PREFILLED_SYRINGE | INTRAMUSCULAR | Status: AC
  Filled 2022-08-04: qty 5

## 2022-08-04 MED ORDER — BUPIVACAINE HCL (PF) 0.25 % IJ SOLN
INTRAMUSCULAR | Status: DC | PRN
  Administered 2022-08-04: 23 mL

## 2022-08-04 MED ORDER — HYDROMORPHONE HCL 1 MG/ML IJ SOLN
INTRAMUSCULAR | Status: DC | PRN
  Administered 2022-08-04: .5 mg via INTRAVENOUS

## 2022-08-04 MED ORDER — OXYCODONE-ACETAMINOPHEN 5-325 MG PO TABS
2.0000 | ORAL_TABLET | ORAL | Status: DC | PRN
  Administered 2022-08-04: 1 via ORAL

## 2022-08-04 MED ORDER — OXYCODONE-ACETAMINOPHEN 5-325 MG PO TABS
ORAL_TABLET | ORAL | Status: AC
  Filled 2022-08-04: qty 1

## 2022-08-04 MED ORDER — DEXAMETHASONE SODIUM PHOSPHATE 10 MG/ML IJ SOLN
INTRAMUSCULAR | Status: AC
  Filled 2022-08-04: qty 1

## 2022-08-04 MED ORDER — DIPHENHYDRAMINE HCL 50 MG/ML IJ SOLN
INTRAMUSCULAR | Status: DC | PRN
  Administered 2022-08-04: 12.5 mg via INTRAVENOUS

## 2022-08-04 MED ORDER — CEFAZOLIN SODIUM-DEXTROSE 2-4 GM/100ML-% IV SOLN
INTRAVENOUS | Status: AC
  Filled 2022-08-04: qty 100

## 2022-08-04 MED ORDER — KETAMINE HCL 10 MG/ML IJ SOLN
INTRAMUSCULAR | Status: DC | PRN
  Administered 2022-08-04 (×3): 10 mg via INTRAVENOUS

## 2022-08-04 MED ORDER — DEXAMETHASONE SODIUM PHOSPHATE 4 MG/ML IJ SOLN
INTRAMUSCULAR | Status: DC | PRN
  Administered 2022-08-04: 10 mg via INTRAVENOUS

## 2022-08-04 MED ORDER — CEFAZOLIN SODIUM-DEXTROSE 2-4 GM/100ML-% IV SOLN
2.0000 g | INTRAVENOUS | Status: AC
  Administered 2022-08-04: 2 g via INTRAVENOUS

## 2022-08-04 MED ORDER — KETOROLAC TROMETHAMINE 30 MG/ML IJ SOLN: 30.0000 mg | Freq: Once | INTRAMUSCULAR | Status: DC | PRN

## 2022-08-04 MED ORDER — MIDAZOLAM HCL 2 MG/2ML IJ SOLN
INTRAMUSCULAR | Status: AC
  Filled 2022-08-04: qty 2

## 2022-08-04 MED ORDER — SODIUM CHLORIDE 0.9 % IR SOLN
Status: DC | PRN
  Administered 2022-08-04: 1000 mL
  Administered 2022-08-04: 100 mL

## 2022-08-04 MED ORDER — HYDROMORPHONE HCL 1 MG/ML IJ SOLN
0.2500 mg | INTRAMUSCULAR | Status: DC | PRN
  Administered 2022-08-04: 0.25 mg via INTRAVENOUS

## 2022-08-04 MED ORDER — KETOROLAC TROMETHAMINE 30 MG/ML IJ SOLN
INTRAMUSCULAR | Status: DC | PRN
  Administered 2022-08-04: 30 mg via INTRAVENOUS

## 2022-08-04 MED ORDER — EPHEDRINE SULFATE (PRESSORS) 50 MG/ML IJ SOLN
INTRAMUSCULAR | Status: DC | PRN
  Administered 2022-08-04: 10 mg via INTRAVENOUS
  Administered 2022-08-04: 15 mg via INTRAVENOUS

## 2022-08-04 MED ORDER — LIDOCAINE IN D5W 4-5 MG/ML-% IV SOLN
INTRAVENOUS | Status: DC | PRN
  Administered 2022-08-04: 1.5 ug/kg/min via INTRAVENOUS

## 2022-08-04 MED ORDER — STERILE WATER FOR IRRIGATION IR SOLN
Status: DC | PRN
  Administered 2022-08-04: 500 mL

## 2022-08-04 SURGICAL SUPPLY — 56 items
ADH SKN CLS APL DERMABOND .7 (GAUZE/BANDAGES/DRESSINGS) ×1
CATH FOLEY 3WAY  5CC 16FR (CATHETERS) ×1
CATH FOLEY 3WAY 5CC 16FR (CATHETERS) ×1 IMPLANT
COVER BACK TABLE 60X90IN (DRAPES) ×1 IMPLANT
COVER TIP SHEARS 8 DVNC (MISCELLANEOUS) ×1 IMPLANT
COVER TIP SHEARS 8MM DA VINCI (MISCELLANEOUS) ×1
DEFOGGER SCOPE WARMER CLEARIFY (MISCELLANEOUS) ×1 IMPLANT
DERMABOND ADVANCED .7 DNX12 (GAUZE/BANDAGES/DRESSINGS) ×1 IMPLANT
DRAPE ARM DVNC X/XI (DISPOSABLE) ×4 IMPLANT
DRAPE COLUMN DVNC XI (DISPOSABLE) ×1 IMPLANT
DRAPE DA VINCI XI ARM (DISPOSABLE) ×4
DRAPE DA VINCI XI COLUMN (DISPOSABLE) ×1
DRAPE UTILITY XL STRL (DRAPES) ×1 IMPLANT
DURAPREP 26ML APPLICATOR (WOUND CARE) ×1 IMPLANT
ELECT REM PT RETURN 9FT ADLT (ELECTROSURGICAL) ×1
ELECTRODE REM PT RTRN 9FT ADLT (ELECTROSURGICAL) ×1 IMPLANT
GAUZE VASELINE 3X9 (GAUZE/BANDAGES/DRESSINGS) ×1 IMPLANT
GLOVE BIO SURGEON STRL SZ 6.5 (GLOVE) ×3 IMPLANT
GLOVE BIO SURGEON STRL SZ7 (GLOVE) IMPLANT
GLOVE BIOGEL PI IND STRL 6 (GLOVE) IMPLANT
GLOVE BIOGEL PI IND STRL 6.5 (GLOVE) IMPLANT
GLOVE BIOGEL PI IND STRL 7.0 (GLOVE) ×5 IMPLANT
GLOVE ECLIPSE 6.0 STRL STRAW (GLOVE) IMPLANT
HOLDER FOLEY CATH W/STRAP (MISCELLANEOUS) IMPLANT
IRRIG SUCT STRYKERFLOW 2 WTIP (MISCELLANEOUS) ×1
IRRIGATION SUCT STRKRFLW 2 WTP (MISCELLANEOUS) ×1 IMPLANT
IV NS 1000ML (IV SOLUTION) ×2
IV NS 1000ML BAXH (IV SOLUTION) IMPLANT
KIT TURNOVER CYSTO (KITS) ×1 IMPLANT
LEGGING LITHOTOMY PAIR STRL (DRAPES) ×1 IMPLANT
OBTURATOR OPTICAL STANDARD 8MM (TROCAR) ×1
OBTURATOR OPTICAL STND 8 DVNC (TROCAR) ×1
OBTURATOR OPTICALSTD 8 DVNC (TROCAR) ×1 IMPLANT
OCCLUDER COLPOPNEUMO (BALLOONS) ×1 IMPLANT
PACK ROBOT WH (CUSTOM PROCEDURE TRAY) ×1 IMPLANT
PACK ROBOTIC GOWN (GOWN DISPOSABLE) ×1 IMPLANT
PACK TRENDGUARD 450 HYBRID PRO (MISCELLANEOUS) IMPLANT
PAD OB MATERNITY 4.3X12.25 (PERSONAL CARE ITEMS) ×1 IMPLANT
PAD PREP 24X48 CUFFED NSTRL (MISCELLANEOUS) ×1 IMPLANT
PROTECTOR NERVE ULNAR (MISCELLANEOUS) ×2 IMPLANT
SEAL CANN UNIV 5-8 DVNC XI (MISCELLANEOUS) ×4 IMPLANT
SEAL XI 5MM-8MM UNIVERSAL (MISCELLANEOUS) ×4
SET IRRIG Y TYPE TUR BLADDER L (SET/KITS/TRAYS/PACK) IMPLANT
SET TRI-LUMEN FLTR TB AIRSEAL (TUBING) ×1 IMPLANT
SPIKE FLUID TRANSFER (MISCELLANEOUS) ×2 IMPLANT
SUT VIC AB 4-0 PS2 27 (SUTURE) ×3 IMPLANT
SUT VICRYL 0 UR6 27IN ABS (SUTURE) ×1 IMPLANT
SUT VLOC 180 0 9IN  GS21 (SUTURE) ×1
SUT VLOC 180 0 9IN GS21 (SUTURE) ×1 IMPLANT
TIP UTERINE 5.1X6CM LAV DISP (MISCELLANEOUS) IMPLANT
TIP UTERINE 6.7X8CM BLUE DISP (MISCELLANEOUS) IMPLANT
TOWEL OR 17X26 10 PK STRL BLUE (TOWEL DISPOSABLE) ×1 IMPLANT
TRAP SPECIMEN MUCUS 40CC (MISCELLANEOUS) IMPLANT
TRENDGUARD 450 HYBRID PRO PACK (MISCELLANEOUS) ×1
TROCAR PORT AIRSEAL 8X120 (TROCAR) ×1 IMPLANT
WATER STERILE IRR 500ML POUR (IV SOLUTION) IMPLANT

## 2022-08-04 NOTE — H&P (Signed)
Tricia Lucas is an 51 y.o. female. G2P2L2 Married  RP:  XI Robotic TLH/BSO, peritoneal washings.    HPI: No change x visit on 05/07/22:  Started on the Progestin only BCPs early June 2023.  Menses still very heavy with blood clots, severe cramps and dizziness.  The periods last up to 10 days. Patient presented to the ER at [facility] in 02/2022. No pelvic pain.  H/O Endometrial Ablation.  Pelvic US 05/07/22 Small Fibroids/Endometrial Polyp/Left ovarian complex cysts.  Patient requesting definitive surgery.   Pertinent Gynecological History: Menses:  Menometrorrhagia Contraception: tubal ligation Blood transfusions: none Sexually transmitted diseases: no past history Previous GYN Procedures:  Bilateral TL and Endometrial Ablation   Last mammogram: normal  Last pap: normal   Menstrual History: Patient's last menstrual period was 06/21/2022 (approximate).    Past Medical History:  Diagnosis Date   Anemia    07/24/22 taking iron supplements   Anxiety    improved per pt as of 07/24/22   COVID-19 03/10/2022   one day of mild symptoms per pt   Depression    Follows with [facility] in [place], [place] 04/01/22 as of 07/21/22.   Endometriosis    Hyperlipidemia    Lumbar radiculopathy 2022   s/p steroid injections, Pt  stated she had an episode of paralysis from the waist down that lasted about an hour. She stated she was given an injection that resolved symptoms.   Migraines    Follows with [facility] in [place], [place] 04/01/22 as of 07/21/22.   Ovarian cyst 12/16/2021   5.3 cm left ovarian cyst on 12/16/21 CT Abd/Pelvis   Pre-diabetes    07/17/21 HgbA1c 5.8, 01/20/22 Hgb A1c 5.7   Shoulder pain, bilateral    chronic   Wears glasses     Past Surgical History:  Procedure Laterality Date   ABLATION  2008   [place]   CHOLECYSTECTOMY  2005   ENDOMETRIAL BIOPSY  05/07/2022   negative   TUBAL LIGATION  2001    Family History  Problem Relation  Age of Onset   Diabetes Mother    Stroke Father    Diabetes Father    Heart attack Father    Diabetes Sister    Hypertension Sister    Diabetes Sister    Stomach cancer Sister    Stroke Sister    Diabetes Sister    Diabetes Sister    Hypertension Brother    Stomach cancer Brother    Diabetes Brother    Diabetes Brother    Diabetes Maternal Grandmother    Diabetes Maternal Grandfather    Diabetes Paternal Grandmother    Diabetes Paternal Grandfather     Social History:  reports that she has never smoked. She has never used smokeless tobacco. She reports that she does not drink alcohol and does not use drugs.  Allergies:  Allergies  Allergen Reactions   Gabapentin     Other reaction(s): Nervous    Medications Prior to Admission  Medication Sig Dispense Refill Last Dose   Iron Combinations (IRON COMPLEX PO) Take by mouth daily.   08/02/2022   norethindrone (MICRONOR) 0.35 MG tablet Take 1 tablet (0.35 mg total) by mouth daily. 84 tablet 4 Past Month   Omega-3 Fatty Acids (OMEGA 3 500 PO) Take by mouth daily.   08/02/2022   fluticasone (FLONASE) 50 MCG/ACT nasal spray Place 1 spray into both nostrils daily.   08/01/2022   IBUPROFEN PO Take by mouth  as needed.   08/02/2022    REVIEW OF SYSTEMS: A ROS was performed and pertinent positives and negatives are included in the history. GENERAL: No fevers or chills. HEENT: No change in vision, no earache, sore throat or sinus congestion. NECK: No pain or stiffness. CARDIOVASCULAR: No chest pain or pressure. No palpitations. PULMONARY: No shortness of breath, cough or wheeze. GASTROINTESTINAL: No abdominal pain, nausea, vomiting or diarrhea, melena or bright red blood per rectum. GENITOURINARY: No urinary frequency, urgency, hesitancy or dysuria. MUSCULOSKELETAL: No joint or muscle pain, no back pain, no recent trauma. DERMATOLOGIC: No rash, no itching, no lesions. ENDOCRINE: No polyuria, polydipsia, no heat or cold intolerance. No recent  change in weight. HEMATOLOGICAL: No anemia or easy bruising or bleeding. NEUROLOGIC: No headache, seizures, numbness, tingling or weakness. PSYCHIATRIC: No depression, no loss of interest in normal activity or change in sleep pattern.     Blood pressure 116/70, pulse 71, temperature 98.3 F (36.8 C), temperature source Oral, resp. rate 16, height 5' 2.5" (1.588 m), weight 73.4 kg, last menstrual period 06/21/2022, SpO2 98 %.  Physical Exam:  Results for orders placed or performed during the hospital encounter of 08/04/22 (from the past 24 hour(s))  Pregnancy, urine POC     Status: None   Collection Time: 08/04/22  5:49 AM  Result Value Ref Range   Preg Test, Ur NEGATIVE NEGATIVE   Pelvic US 05/07/22:  T/V images.  Retroverted uterus normal in size and shape with small intramural fibroids noted.  The overall uterine size is measured at 8.08 x 4.86 x 4.16 cm.  Heterogeneous asymmetrical endometrium status post endometrial ablation measured at 7.66 mm with echogenic focal area at the fundal portion of the canal with prominent feeder vessel measuring 0.9 x 0.5 cm, probable endometrial polyp.  Trace amount of intracavitary fluid surrounding this area.  Sonohysterogram therefore not performed.  Right ovary is atrophic in appearance within normal limits with positive perfusion to the ovary.  Left ovary is enlarged with an avascular complex cyst with low-level echoes and debris's measuring 3.8 x 3.1 x 3.9 cm, hemorrhagic in appearance, a smaller minimally complex avascular cyst measuring 1.7 x 1.7 cm is also present.  Positive perfusion to the left ovary.  No adnexal mass.  Trace amount of free fluid adjacent to the left ovary.   Endometrial biopsy 05/07/22:FINAL MICROSCOPIC DIAGNOSIS:   A. ENDOMETRIUM, BIOPSY:  Superficial fragments of benign proliferative endometrium  Benign endocervical epithelium  Negative for breakdown, polyp, atypia, hyperplasia and carcinoma    Ca 125 normal at 11 on 05/07/22    CBC 07/29/22 Hb normal at 14.2, WBC normal at 7.0    Assessment/Plan:  51 y.o. G2P0002    1. Menorrhagia with regular cycle No change x visit on 05/07/22:  Started on the Progestin only BCPs early June 2023.  H/O Endometrial Ablation.  Pelvic US today with small fibroids/endometrial polyp/Lt complex ovarian cysts. Ca 125 normal at 11.  EBx Benign.  Patient requesting definitive surgery. Decision to proceed with XI Robotic TLH/BSO, peritoneal washings.  Benefits/risks of BSO thoroughly reviewed with patient.  Decision made to remove the ovaries.  Preop preparation, surgery and risks, postop expectations and precautions discussed.  Patient voiced understanding and agreement with the plan.   2. Endometrial mass Probable Endometrial Polyp.  EBx benign.   3. History of endometrial ablation Limiting access to the IU cavity.   4. Left ovarian cyst Left complex ovarian cyst.  Ca 125 normal at 11 on 05/07/22.  Counseling on benefits vs risks of removing the ovaries or preserving them.  Patient decided to proceed with BSO with the TLH.                         Patient was counseled as to the risk of surgery to include the following:   1. Infection (prohylactic antibiotics will be administered)   2. DVT/Pulmonary Embolism (prophylactic pneumo compression stockings will be used)   3.Trauma to internal organs requiring additional surgical procedure to repair any injury to internal organs requiring perhaps additional hospitalization days.   4.Hemmorhage requiring transfusion and blood products which carry risks such as     anaphylactic reaction, hepatitis and AIDS   Patient had received literature information on the procedure scheduled and all her questions were answered and fully accepts all risk.  Marie-Lyne Braheem Tomasik 08/04/2022, 7:17 AM

## 2022-08-04 NOTE — Op Note (Signed)
Operative Note  08/04/2022  10:33 AM  PATIENT:  Tricia Lucas  51 y.o. female  PRE-OPERATIVE DIAGNOSIS:  thickened endometrium, endometrial polyp, fibroids, left ovarian cyst  POST-OPERATIVE DIAGNOSIS:  thickened endometrium, endometrial polyp, fibroids, left ovarian cyst with adhesions, mild pelvic endometriosis, adhesions between bladder and anterior lower uterine segment   PROCEDURE:  Procedure(s): XI ROBOTIC ASSISTED TOTAL HYSTERECTOMY WITH BILATERAL SALPINGO OOPHORECTOMY;peritoneal washings  SURGEON:  Surgeon(s): Princess Bruins, MD  ANESTHESIA:   general  FINDINGS: Uterus status post endometrial ablation, status post bilateral tubal ligation.  Left ovarian cyst of benign appearance with adhesions.  Mild pelvic endometriosis. Adhesions between the bladder and the anterior lower uterine segment.  DESCRIPTION OF OPERATION: Under general anesthesia with endotracheal intubation, the patient is in lithotomy position.  She is prepped with DuraPrep on the abdomen and with Betadine on the suprapubic, vulvar and vaginal areas.  Timeout is done.  Deep Trendelenburg is tested.  The patient is draped as usual.  The Foley is put in place in the bladder.  The vaginal exam reveals a retroverted uterus, normal in volume and mobile.  No adnexal mass.  We used a #8 Rumi with the small Koh ring.  Placement was made more difficult given the history of endometrial ablation.  The other instruments were removed.  We went to the abdomen.      The supraumbilical area was infiltrated with Marcaine one quarter plain.  A 1.5 cm incision was made at that level.  The aponeurosis was grasped with cokers and opened under direct vision with Mayo scissors.  The parietal peritoneum was grasped with hemostats and opened under direct vision with Mayo scissors.  A pursestring stitch of Vicryl 0 was done in the aponeurosis.  The Sheryle Hail was inserted under direct vision and up pneumoperitoneum was created.  Abdominal pelvic  inspection revealed a clear anterior abdominal wall for port placement.  The port sites were marked with a surgical pen, infiltrated with Marcaine one quarter plain and incised with a scalpel.  All ports were inserted under direct vision with 2 robotic ports on the right in line with the umbilical line and 1 robotic port on the distal left in line with the umbilical line.  The assistant port was an 8 mm ports inserted at the left medial aspect just above the umbilical line.  The patient was positioned in 30 degree Trendelenburg.  The robot was docked.  The robotic instruments were inserted under direct vision and connected.  We used the long bipolar in the first arm, the camera in the second arm, the scissors in the third arm and the fenestrated clamp in the fourth arm.  We went to the console.      Inspection of the pelvic cavity revealed that the ureters were in normal anatomic position with good peristalsis. The uterus was normal in size and appearance.  Status post bilateral tubal ligation.  The right ovary was normal in size and appearance.  The left ovary presented cysts of benign appearance with fine adhesions to the left ovarian fossa.  The bladder was adherent to the anterior lower uterine segment.  Pictures were taken of all the pelvic organs.  The uterus had been perforated right in the middle at the posterior fundus by the Rumi.  We started on the right side with cauterization and section of the right infundibulopelvic ligament.  We cauterized and sectioned just below the right ovary we then cauterized and sectioned the right round ligament the visceral peritoneum was opened  anteriorly and we started distending the bladder.  We proceeded the same way on the left side, with the addition of lysis of the filmy adhesions between the left ovary and the left ovarian fossa.  The bladder was filled with sterile water to visualize its upper aspect better.  That allowed safe lysis of adhesions and descent of the  bladder past the [facility] ring.  We then cauterized the backflow from the uterine artery on either side of the uterus.  We cauterized and sectioned the right uterine artery.  We cauterized and sectioned the left uterine artery.  The vaginal occluder was inflated.  We opened the vagina as high as possible on top of the Koh ring with the tip of the scissors anteriorly then bilaterally and finished posteriorly.  The uterus with the cervix, bilateral tubes and bilateral ovaries were removed vaginally and sent to pathology.  Superficial lesions of endometriosis were present at the right uterosacral ligament.  Pictures were taken.  We irrigated the pelvis and suction.  We completed hemostasis with the bipolar where necessary.  Hemostasis was adequate at all levels.  We used a V-Loc 0 at 9 inches to close the vaginal vault.  We started at the right angle, did a running suture to the left angle and back on our steps.  The needle was removed from the abdominopelvic cavity.  We irrigated and suction.  Hemostasis was adequate at all levels.  Peristalsis was good at the ureters bilaterally.  Urine was clear.  Pictures were taken.  All robotic instruments were removed.  The robot was undocked.  All ports were removed under direct vision.  The CO2 was evacuated.  The pursestring stitch was attached at the aponeurosis.  All skin incisions were closed with a subcuticular suture of Vicryl 4-0.  Hemostasis was adequate at all levels.  The occluder was removed from the vagina.  The Foley was removed from the bladder.  The patient was brought to recovery room in good and stable status.  ESTIMATED BLOOD LOSS: 50 mL   Intake/Output Summary (Last 24 hours) at 08/04/2022 1033 Last data filed at 08/04/2022 1021 Gross per 24 hour  Intake 1400 ml  Output 150 ml  Net 1250 ml     BLOOD ADMINISTERED:none   LOCAL MEDICATIONS USED:  MARCAINE     SPECIMEN:  Source of Specimen:  Uterus with cervix, bilateral tubes and  ovaries  DISPOSITION OF SPECIMEN:  PATHOLOGY  COUNTS:  YES  PLAN OF CARE: Transfer to PACU  Marie-Lyne LavoieMD10:33 AM

## 2022-08-04 NOTE — Transfer of Care (Signed)
Immediate Anesthesia Transfer of Care Note  Patient: Wynona Duhamel  Procedure(s) Performed: XI ROBOTIC ASSISTED TOTAL HYSTERECTOMY WITH BILATERAL SALPINGO OOPHORECTOMY;peritoneal washings (Abdomen)  Patient Location: PACU  Anesthesia Type:General  Level of Consciousness: drowsy and patient cooperative  Airway & Oxygen Therapy: Patient Spontanous Breathing and Patient connected to nasal cannula oxygen  Post-op Assessment: Report given to RN and Post -op Vital signs reviewed and stable  Post vital signs: Reviewed and stable  Last Vitals:  Vitals Value Taken Time  BP 120/66 08/04/22 1026  Temp    Pulse 87 08/04/22 1029  Resp 10 08/04/22 1029  SpO2 100 % 08/04/22 1029  Vitals shown include unvalidated device data.  Last Pain:  Vitals:   08/04/22 0604  TempSrc: Oral  PainSc: 5       Patients Stated Pain Goal: 5 (35/57/32 2025)  Complications: No notable events documented.

## 2022-08-04 NOTE — Anesthesia Procedure Notes (Signed)
Procedure Name: Intubation Date/Time: 08/04/2022 7:49 AM  Performed by: Justice Rocher, CRNAPre-anesthesia Checklist: Patient identified, Emergency Drugs available, Suction available, Patient being monitored and Timeout performed Patient Re-evaluated:Patient Re-evaluated prior to induction Oxygen Delivery Method: Circle system utilized Preoxygenation: Pre-oxygenation with 100% oxygen Induction Type: IV induction Ventilation: Mask ventilation without difficulty Laryngoscope Size: Mac and 3 Grade View: Grade II Tube type: Oral Tube size: 7.0 mm Number of attempts: 1 Airway Equipment and Method: Stylet and Oral airway Placement Confirmation: ETT inserted through vocal cords under direct vision, positive ETCO2, breath sounds checked- equal and bilateral and CO2 detector Secured at: 23 cm Tube secured with: Tape Dental Injury: Teeth and Oropharynx as per pre-operative assessment  Comments: Dry chapped lips preop

## 2022-08-04 NOTE — Anesthesia Postprocedure Evaluation (Signed)
Anesthesia Post Note  Patient: Tricia Lucas  Procedure(s) Performed: XI ROBOTIC ASSISTED TOTAL HYSTERECTOMY WITH BILATERAL SALPINGO OOPHORECTOMY;peritoneal washings (Abdomen)     Patient location during evaluation: PACU Anesthesia Type: General Level of consciousness: awake and alert Pain management: pain level controlled Vital Signs Assessment: post-procedure vital signs reviewed and stable Respiratory status: spontaneous breathing, nonlabored ventilation, respiratory function stable and patient connected to nasal cannula oxygen Cardiovascular status: blood pressure returned to baseline and stable Postop Assessment: no apparent nausea or vomiting Anesthetic complications: no  No notable events documented.  Last Vitals:  Vitals:   08/04/22 1215 08/04/22 1330  BP: (!) 151/70 134/88  Pulse: (!) 101 100  Resp: 16 14  Temp:    SpO2: 100% 100%    Last Pain:  Vitals:   08/04/22 1330  TempSrc:   PainSc: 0-No pain                 Barnet Glasgow

## 2022-08-05 ENCOUNTER — Encounter (HOSPITAL_BASED_OUTPATIENT_CLINIC_OR_DEPARTMENT_OTHER): Payer: Self-pay | Admitting: Obstetrics & Gynecology

## 2022-08-05 LAB — CYTOLOGY - NON PAP

## 2022-08-05 LAB — SURGICAL PATHOLOGY

## 2022-08-18 ENCOUNTER — Ambulatory Visit (INDEPENDENT_AMBULATORY_CARE_PROVIDER_SITE_OTHER): Payer: Commercial Managed Care - HMO | Admitting: Obstetrics & Gynecology

## 2022-08-18 ENCOUNTER — Encounter: Payer: Self-pay | Admitting: Obstetrics & Gynecology

## 2022-08-18 ENCOUNTER — Encounter: Payer: Commercial Managed Care - HMO | Admitting: Obstetrics & Gynecology

## 2022-08-18 VITALS — BP 110/80 | HR 73

## 2022-08-18 DIAGNOSIS — Z09 Encounter for follow-up examination after completed treatment for conditions other than malignant neoplasm: Secondary | ICD-10-CM

## 2022-08-18 DIAGNOSIS — R102 Pelvic and perineal pain: Secondary | ICD-10-CM

## 2022-08-18 MED ORDER — IBUPROFEN 800 MG PO TABS: 800.0000 mg | ORAL_TABLET | Freq: Three times a day (TID) | ORAL | 1 refills | Status: AC | PRN

## 2022-08-18 NOTE — Progress Notes (Signed)
    Tricia Lucas 14-Jun-1971 [id number]        51 y.o.  G2P2002   RP: Postop XI TLH/BSO on 08/04/22  HPI: Postop XI TLH/BSO on 08/04/22, healing very well postop.  Mild pelvic discomfort going to the lower back with irradiation to the back of the legs.  Mild vaginal numbing sensation.  No vaginal bleeding, no discharge.  Urine normal.  BMs normal.  No fever.   OB History  Gravida Para Term Preterm AB Living  '2 2 2     2  '$ SAB IAB Ectopic Multiple Live Births               # Outcome Date GA Lbr Len/2nd Weight Sex Delivery Anes PTL Lv  2 Term           1 Term             Past medical history,surgical history, problem list, medications, allergies, family history and social history were all reviewed and documented in the EPIC chart.   Directed ROS with pertinent positives and negatives documented in the history of present illness/assessment and plan.  Exam:  Vitals:   08/18/22 1000  BP: 110/80  Pulse: 73  SpO2: 99%   General appearance:  Normal  Abdomen: Soft, NT, not distended.  Incisions closed, no erythema, no induration.  Gynecologic exam: Vulva normal.  Speculum:  Vaginal vault closed, no erythema.  No discharge.  Patho 08/04/22: FINAL MICROSCOPIC DIAGNOSIS:   A. UTERUS, CERVIX, BILATERAL FALLOPIAN TUBES AND OVARIES:  - Uterus with benign inactive endometrium  - Benign unremarkable cervix  - Benign unremarkable bilateral fallopian tubes and ovaries  - No evidence of malignancy   U/A:  Light yellow, slightly cloudy, Nit Neg, Pro Neg, WBC 0-5, RBC 0-2, Bacteria Few.   Pending U. Culture.   Assessment/Plan:  51 y.o. G2P2002   1. Status post gynecological surgery, follow-up exam Postop XI TLH/BSO on 08/04/22, healing very well postop.  Mild pelvic discomfort going to the lower back with irradiation to the back of the legs.  Mild vaginal numbing sensation.  No vaginal bleeding, no discharge.  Urine normal.  BMs normal.  No fever. Normal postop exam today.  Pending U/A.   Precautions reviewed.  F/U in 4 weeks to check the vaginal vault before resuming sexual activities.  Letter OOW x 8 weeks given.  2. Pelvic pain Mild pelvic discomfort.  No UTI Sx otherwise. U/A mildly abnormal, will wait on U. Culture.   - Urinalysis,Complete w/RFL Culture  Other orders - ibuprofen (ADVIL) 800 MG tablet; Take 1 tablet (800 mg total) by mouth every 8 (eight) hours as needed.   Princess Bruins MD, 10:06 AM 08/18/2022

## 2022-08-19 ENCOUNTER — Encounter: Payer: Self-pay | Admitting: Obstetrics & Gynecology

## 2022-08-21 LAB — URINALYSIS, COMPLETE W/RFL CULTURE
Bilirubin Urine: NEGATIVE
Casts: NONE SEEN /LPF
Crystals: NONE SEEN /HPF
Glucose, UA: NEGATIVE
Hyaline Cast: NONE SEEN /LPF
Ketones, ur: NEGATIVE
Nitrites, Initial: NEGATIVE
Protein, ur: NEGATIVE
Specific Gravity, Urine: 1.01 (ref 1.001–1.035)
Yeast: NONE SEEN /HPF
pH: 6.5 (ref 5.0–8.0)

## 2022-08-21 LAB — URINE CULTURE
MICRO NUMBER:: 14303041
SPECIMEN QUALITY:: ADEQUATE

## 2022-08-21 LAB — CULTURE INDICATED

## 2022-08-24 ENCOUNTER — Other Ambulatory Visit: Payer: Self-pay | Admitting: *Deleted

## 2022-08-24 MED ORDER — SULFAMETHOXAZOLE-TRIMETHOPRIM 800-160 MG PO TABS: 1.0000 | ORAL_TABLET | Freq: Two times a day (BID) | ORAL | 0 refills | Status: DC

## 2022-09-15 ENCOUNTER — Encounter: Payer: Self-pay | Admitting: Obstetrics & Gynecology

## 2022-09-15 ENCOUNTER — Emergency Department (HOSPITAL_BASED_OUTPATIENT_CLINIC_OR_DEPARTMENT_OTHER)
Admission: EM | Admit: 2022-09-15 | Discharge: 2022-09-15 | Disposition: A | Discharge status: Home / Self Care | Payer: BLUE CROSS/BLUE SHIELD | Attending: Emergency Medicine | Admitting: Emergency Medicine

## 2022-09-15 ENCOUNTER — Emergency Department (HOSPITAL_BASED_OUTPATIENT_CLINIC_OR_DEPARTMENT_OTHER): Payer: BLUE CROSS/BLUE SHIELD

## 2022-09-15 ENCOUNTER — Other Ambulatory Visit: Payer: Self-pay

## 2022-09-15 ENCOUNTER — Ambulatory Visit (INDEPENDENT_AMBULATORY_CARE_PROVIDER_SITE_OTHER): Payer: BLUE CROSS/BLUE SHIELD | Admitting: Obstetrics & Gynecology

## 2022-09-15 ENCOUNTER — Encounter (HOSPITAL_BASED_OUTPATIENT_CLINIC_OR_DEPARTMENT_OTHER): Payer: Self-pay | Admitting: Urology

## 2022-09-15 VITALS — BP 118/74 | HR 94

## 2022-09-15 DIAGNOSIS — Z8616 Personal history of COVID-19: Secondary | ICD-10-CM | POA: Diagnosis not present

## 2022-09-15 DIAGNOSIS — Z09 Encounter for follow-up examination after completed treatment for conditions other than malignant neoplasm: Secondary | ICD-10-CM

## 2022-09-15 DIAGNOSIS — N99820 Postprocedural hemorrhage and hematoma of a genitourinary system organ or structure following a genitourinary system procedure: Secondary | ICD-10-CM | POA: Diagnosis not present

## 2022-09-15 DIAGNOSIS — N951 Menopausal and female climacteric states: Secondary | ICD-10-CM

## 2022-09-15 DIAGNOSIS — N809 Endometriosis, unspecified: Secondary | ICD-10-CM

## 2022-09-15 DIAGNOSIS — N939 Abnormal uterine and vaginal bleeding, unspecified: Secondary | ICD-10-CM | POA: Diagnosis present

## 2022-09-15 LAB — COMPREHENSIVE METABOLIC PANEL
ALT: 56 U/L — ABNORMAL HIGH (ref 0–44)
AST: 27 U/L (ref 15–41)
Albumin: 4.1 g/dL (ref 3.5–5.0)
Alkaline Phosphatase: 86 U/L (ref 38–126)
Anion gap: 9 (ref 5–15)
BUN: 12 mg/dL (ref 6–20)
CO2: 25 mmol/L (ref 22–32)
Calcium: 9.2 mg/dL (ref 8.9–10.3)
Chloride: 105 mmol/L (ref 98–111)
Creatinine, Ser: 0.8 mg/dL (ref 0.44–1.00)
GFR, Estimated: >60 mL/min (ref >60)
Glucose, Bld: 103 mg/dL — ABNORMAL HIGH (ref 70–99)
Potassium: 3.7 mmol/L (ref 3.5–5.1)
Sodium: 139 mmol/L (ref 135–145)
Total Bilirubin: 0.3 mg/dL (ref 0.3–1.2)
Total Protein: 7.3 g/dL (ref 6.5–8.1)

## 2022-09-15 LAB — URINALYSIS, MICROSCOPIC (REFLEX)

## 2022-09-15 LAB — URINALYSIS, ROUTINE W REFLEX MICROSCOPIC
Bilirubin Urine: NEGATIVE
Glucose, UA: NEGATIVE mg/dL
Ketones, ur: NEGATIVE mg/dL
Nitrite: NEGATIVE
Protein, ur: NEGATIVE mg/dL
Specific Gravity, Urine: 1.02 (ref 1.005–1.030)
pH: 6.5 (ref 5.0–8.0)

## 2022-09-15 LAB — CBC WITH DIFFERENTIAL/PLATELET
Abs Immature Granulocytes: 0.01 10*3/uL (ref 0.00–0.07)
Basophils Absolute: 0 10*3/uL (ref 0.0–0.1)
Basophils Relative: 1 %
Eosinophils Absolute: 0.2 10*3/uL (ref 0.0–0.5)
Eosinophils Relative: 2 %
HCT: 40.3 % (ref 36.0–46.0)
Hemoglobin: 13.6 g/dL (ref 12.0–15.0)
Immature Granulocytes: 0 %
Lymphocytes Relative: 48 %
Lymphs Abs: 3.3 10*3/uL (ref 0.7–4.0)
MCH: 30.5 pg (ref 26.0–34.0)
MCHC: 33.7 g/dL (ref 30.0–36.0)
MCV: 90.4 fL (ref 80.0–100.0)
Monocytes Absolute: 0.5 10*3/uL (ref 0.1–1.0)
Monocytes Relative: 8 %
Neutro Abs: 2.7 10*3/uL (ref 1.7–7.7)
Neutrophils Relative %: 41 %
Platelets: 315 10*3/uL (ref 150–400)
RBC: 4.46 MIL/uL (ref 3.87–5.11)
RDW: 12.3 % (ref 11.5–15.5)
WBC: 6.7 10*3/uL (ref 4.0–10.5)
nRBC: 0 % (ref 0.0–0.2)

## 2022-09-15 MED ORDER — IOHEXOL 300 MG/ML  SOLN
100.0000 mL | Freq: Once | INTRAMUSCULAR | Status: AC | PRN
  Administered 2022-09-15: 100 mL via INTRAVENOUS

## 2022-09-15 NOTE — Progress Notes (Signed)
    Tricia Lucas [birth year] [id number]        52 y.o.  G2P2002   RP: Postop  XI TLH/BSO on 08/04/22   HPI: Postop XI TLH/BSO on 08/04/22, healing very well postop.  Severe hot flushes.  Mildly tender at umbilical incision, improving.  Mild vaginal bleeding, presented at ER earlier today, small blood clot, vaginal vault well closed.  No vaginal discharge.  Urine normal.  BMs normal. No fever.    OB History  Gravida Para Term Preterm AB Living  '2 2 2     2  '$ SAB IAB Ectopic Multiple Live Births               # Outcome Date GA Lbr Len/2nd Weight Sex Delivery Anes PTL Lv  2 Term           1 Term             Past medical history,surgical history, problem list, medications, allergies, family history and social history were all reviewed and documented in the EPIC chart.   Directed ROS with pertinent positives and negatives documented in the history of present illness/assessment and plan.  Exam:  Vitals:   09/15/22 1527  BP: 118/74  Pulse: 94  SpO2: 99%   General appearance:  Normal  Abdomen: All incisions well closed, no drainage, no erythema, NT.  Small suture knots trimmed.  Gynecologic exam: Vulva normal.  Speculum:  Vaginal vault well closed.  No sign of infection.  No active bleeding, no clot.  Very small dark old blood.   Assessment/Plan:  52 y.o. G2P2002   1. Status post gynecological surgery, follow-up exam Postop XI TLH/BSO on 08/04/22, healing very well postop.  Severe hot flushes.  Mildly tender at umbilical incision, improving.  Mild vaginal bleeding, presented at ER earlier today, small blood clot, vaginal vault well closed.  No vaginal discharge.  Urine normal.  BMs normal. No fever.  Vaginal vault well closed with no active bleeding, no clot, minimal dark old blood.  Incisions healing well.  Patient reassured.  Precautions discussed.  Continue pelvic rest and limited physical activities for another 2 weeks.  Letter to return to work in 2 wks rather than next wk  given.  2. Menopausal syndrome Estrogen replacement therapy discussed. Anti-Depressants and Veozah reviewed.  Patient prefers to try Natural Estrogen such as Ashwagandha.  3. Endometriosis Mild Endometriosis at time of surgery.  Other orders - UNKNOWN TO PATIENT; Allergy medication for rash (allergic reaction)   Princess Bruins MD, 3:35 PM 09/15/2022

## 2022-09-15 NOTE — ED Provider Notes (Signed)
[facility] Provider Note: Georgena Spurling, MD, FACEP  CSN: [id number] MRN: [id number] ARRIVAL: 09/15/22 at [place]: [place]  Post-op Problem   HISTORY OF PRESENT ILLNESS  09/15/22 3:52 AM Tricia Lucas is a 52 y.o. female who underwent a robotic assisted total hysterectomy with bilateral salpingo-oophorectomy by Dr. Dellis Filbert on 08/04/2022.  She is here with vaginal bleeding that started yesterday.  She is having some periumbilical pain which she rates as a 3 out of 10 this pain has been present since soon after surgery.  She has observed pelvic rest since her surgery.   Past Medical History:  Diagnosis Date   Anemia    07/24/22 taking iron supplements   Anxiety    improved per pt as of 07/24/22   COVID-19 03/10/2022   one day of mild symptoms per pt   Depression    Follows with [facility] in [place], [place] 04/01/22 as of 07/21/22.   Endometriosis    Hyperlipidemia    Lumbar radiculopathy 2022   s/p steroid injections, Pt  stated she had an episode of paralysis from the waist down that lasted about an hour. She stated she was given an injection that resolved symptoms.   Migraines    Follows with [facility] in [place], [place] 04/01/22 as of 07/21/22.   Ovarian cyst 12/16/2021   5.3 cm left ovarian cyst on 12/16/21 CT Abd/Pelvis   Pre-diabetes    07/17/21 HgbA1c 5.8, 01/20/22 Hgb A1c 5.7   Shoulder pain, bilateral    chronic   Wears glasses     Past Surgical History:  Procedure Laterality Date   ABLATION  2008   CESAREAN SECTION  1995   CHOLECYSTECTOMY  2005   ENDOMETRIAL BIOPSY  05/07/2022   negative   ROBOTIC ASSISTED TOTAL HYSTERECTOMY WITH BILATERAL SALPINGO OOPHERECTOMY N/A 08/04/2022   Procedure: XI ROBOTIC ASSISTED TOTAL HYSTERECTOMY WITH BILATERAL SALPINGO OOPHORECTOMY;peritoneal washings;  Surgeon: Princess Bruins, MD;  Location: [facility];  Service: Gynecology;  Laterality: N/A;    TUBAL LIGATION  2001    Family History  Problem Relation Age of Onset   Diabetes Mother    Stroke Father    Diabetes Father    Heart attack Father    Diabetes Sister    Hypertension Sister    Diabetes Sister    Stomach cancer Sister    Stroke Sister    Diabetes Sister    Diabetes Sister    Hypertension Brother    Stomach cancer Brother    Diabetes Brother    Diabetes Brother    Diabetes Maternal Grandmother    Diabetes Maternal Grandfather    Diabetes Paternal Grandmother    Diabetes Paternal Grandfather     Social History   Tobacco Use   Smoking status: Never   Smokeless tobacco: Never  Vaping Use   Vaping Use: Never used  Substance Use Topics   Alcohol use: No   Drug use: No    Prior to Admission medications   Medication Sig Start Date End Date Taking? Authorizing Provider  sulfamethoxazole-trimethoprim (BACTRIM DS) 800-160 MG tablet Take 1 tablet by mouth 2 (two) times daily. 08/24/22   Princess Bruins, MD  ibuprofen (ADVIL) 800 MG tablet Take 1 tablet (800 mg total) by mouth every 8 (eight) hours as needed. 08/18/22   Princess Bruins, MD    Allergies Gabapentin   REVIEW OF SYSTEMS  Negative except as noted here or in the History of  Present Illness.   PHYSICAL EXAMINATION  Initial Vital Signs Blood pressure (!) 163/91, pulse 81, temperature 98 F (36.7 C), resp. rate 20, height '5\' 2"'$  (1.575 m), weight 72.6 kg, last menstrual period 06/21/2022, SpO2 100 %.  Examination General: Well-developed, well-nourished female in no acute distress; appearance consistent with age of record HENT: normocephalic; atraumatic Eyes: Normal appearance Neck: supple Heart: regular rate and rhythm Lungs: clear to auscultation bilaterally Abdomen: soft; nondistended; mild periumbilical tenderness; bowel sounds present GU: Normal external genitalia; small amount of bleeding in vaginal vault; some clotted blood along the edge of the sutured vaginal cuff without obvious  dehiscence Extremities: No deformity; full range of motion; pulses normal Neurologic: Awake, alert and oriented; motor function intact in all extremities and symmetric; no facial droop Skin: Warm and dry Psychiatric: Normal mood and affect   RESULTS  Summary of this visit's results, reviewed and interpreted by myself:   EKG Interpretation  Date/Time:    Ventricular Rate:    PR Interval:    QRS Duration:   QT Interval:    QTC Calculation:   R Axis:     Text Interpretation:         Laboratory Studies: Results for orders placed or performed during the hospital encounter of 09/15/22 (from the past 24 hour(s))  CBC with Differential     Status: None   Collection Time: 09/15/22 12:40 AM  Result Value Ref Range   WBC 6.7 4.0 - 10.5 K/uL   RBC 4.46 3.87 - 5.11 MIL/uL   Hemoglobin 13.6 12.0 - 15.0 g/dL   HCT 40.3 36.0 - 46.0 %   MCV 90.4 80.0 - 100.0 fL   MCH 30.5 26.0 - 34.0 pg   MCHC 33.7 30.0 - 36.0 g/dL   RDW 12.3 11.5 - 15.5 %   Platelets 315 150 - 400 K/uL   nRBC 0.0 0.0 - 0.2 %   Neutrophils Relative % 41 %   Neutro Abs 2.7 1.7 - 7.7 K/uL   Lymphocytes Relative 48 %   Lymphs Abs 3.3 0.7 - 4.0 K/uL   Monocytes Relative 8 %   Monocytes Absolute 0.5 0.1 - 1.0 K/uL   Eosinophils Relative 2 %   Eosinophils Absolute 0.2 0.0 - 0.5 K/uL   Basophils Relative 1 %   Basophils Absolute 0.0 0.0 - 0.1 K/uL   Immature Granulocytes 0 %   Abs Immature Granulocytes 0.01 0.00 - 0.07 K/uL  Comprehensive metabolic panel     Status: Abnormal   Collection Time: 09/15/22 12:40 AM  Result Value Ref Range   Sodium 139 135 - 145 mmol/L   Potassium 3.7 3.5 - 5.1 mmol/L   Chloride 105 98 - 111 mmol/L   CO2 25 22 - 32 mmol/L   Glucose, Bld 103 (H) 70 - 99 mg/dL   BUN 12 6 - 20 mg/dL   Creatinine, Ser 0.80 0.44 - 1.00 mg/dL   Calcium 9.2 8.9 - 10.3 mg/dL   Total Protein 7.3 6.5 - 8.1 g/dL   Albumin 4.1 3.5 - 5.0 g/dL   AST 27 15 - 41 U/L   ALT 56 (H) 0 - 44 U/L   Alkaline Phosphatase  86 38 - 126 U/L   Total Bilirubin 0.3 0.3 - 1.2 mg/dL   GFR, Estimated >60 >60 mL/min   Anion gap 9 5 - 15  Urinalysis, Routine w reflex microscopic Urine, Clean Catch     Status: Abnormal   Collection Time: 09/15/22 12:41 AM  Result Value Ref  Range   Color, Urine YELLOW YELLOW   APPearance CLEAR CLEAR   Specific Gravity, Urine 1.020 1.005 - 1.030   pH 6.5 5.0 - 8.0   Glucose, UA NEGATIVE NEGATIVE mg/dL   Hgb urine dipstick MODERATE (A) NEGATIVE   Bilirubin Urine NEGATIVE NEGATIVE   Ketones, ur NEGATIVE NEGATIVE mg/dL   Protein, ur NEGATIVE NEGATIVE mg/dL   Nitrite NEGATIVE NEGATIVE   Leukocytes,Ua SMALL (A) NEGATIVE  Urinalysis, Microscopic (reflex)     Status: Abnormal   Collection Time: 09/15/22 12:41 AM  Result Value Ref Range   RBC / HPF 11-20 0 - 5 RBC/hpf   WBC, UA 0-5 0 - 5 WBC/hpf   Bacteria, UA FEW (A) NONE SEEN   Squamous Epithelial / HPF 0-5 0 - 5 /HPF   Non Squamous Epithelial PRESENT (A) NONE SEEN   Imaging Studies: CT ABDOMEN PELVIS W CONTRAST  Result Date: 09/15/2022 CLINICAL DATA:  Postpartum hemorrhage status post total hysterectomy. Vaginal bleeding EXAM: CT ABDOMEN AND PELVIS WITH CONTRAST TECHNIQUE: Multidetector CT imaging of the abdomen and pelvis was performed using the standard protocol following bolus administration of intravenous contrast. RADIATION DOSE REDUCTION: This exam was performed according to the departmental dose-optimization program which includes automated exposure control, adjustment of the mA and/or kV according to patient size and/or use of iterative reconstruction technique. CONTRAST:  133m OMNIPAQUE IOHEXOL 300 MG/ML  SOLN COMPARISON:  12/16/2021 FINDINGS: Lower chest: No acute abnormality. Hepatobiliary: Hepatic steatosis. Cholecystectomy. No biliary dilation. Pancreas: Unremarkable. No pancreatic ductal dilatation or surrounding inflammatory changes. Spleen: Normal in size without focal abnormality. Adrenals/Urinary Tract: Unremarkable  adrenal glands. No urinary calculi or hydronephrosis. Bladder wall thickening. Stomach/Bowel: Normal caliber large and small bowel. Moderate colonic stool load. Normal appendix. Vascular/Lymphatic: No suspicious lymphadenopathy. No acute vascular abnormality. Reproductive: Status post hysterectomy. No adnexal masses. Other: No free intraperitoneal fluid or air. Musculoskeletal: No acute osseous abnormality. IMPRESSION: Diffuse bladder wall thickening may be due to cystitis or underdistention. Hepatic steatosis. Hysterectomy. Electronically Signed   By: TPlacido SouM.D.   On: 09/15/2022 02:10    ED COURSE and MDM  Nursing notes, initial and subsequent vitals signs, including pulse oximetry, reviewed and interpreted by myself.  Vitals:   09/15/22 0035 09/15/22 0035  BP:  (!) 163/91  Pulse:  81  Resp:  20  Temp:  98 F (36.7 C)  SpO2:  100%  Weight: 72.6 kg   Height: '5\' 2"'$  (1.575 m)    Medications  iohexol (OMNIPAQUE) 300 MG/ML solution 100 mL (100 mLs Intravenous Contrast Given 09/15/22 0146)   4:17 AM Discussed with Dr. ERip Harbourof OB/GYN.  He states that some light bleeding from the cuff is common.  He will let Dr. LDellis Filbertknow about this.  The patient has an appointment with her later this afternoon.   PROCEDURES  Procedures   ED DIAGNOSES     ICD-10-CM   1. Postoperative vaginal bleeding following genitourinary procedure  NW38.882         MShanon Rosser MD 09/15/22 [id number]

## 2022-09-15 NOTE — ED Triage Notes (Signed)
Pt states total hysterectomy 6 weeks ago and is now having vaginal bleeding that started today  Pain at umbilicus

## 2022-12-18 IMAGING — DX DG LUMBAR SPINE COMPLETE 4+V
5 series · 5 of 5 positions shown · non-contrast
Comparison: None.

CLINICAL DATA: 49-year-old female with radicular pain.

EXAM:
LUMBAR SPINE - COMPLETE 4+ VIEW

[l-spine ap]
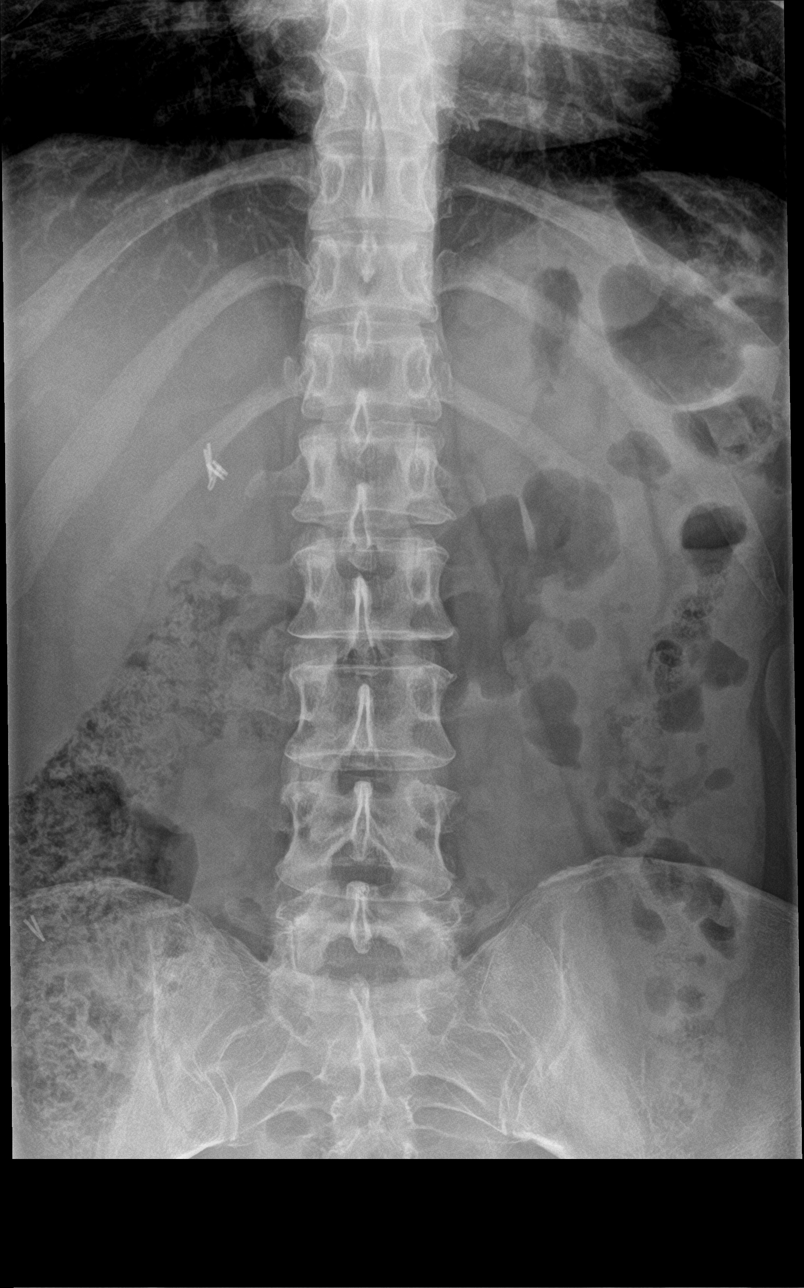

[l-spine obl (1 of 2)]
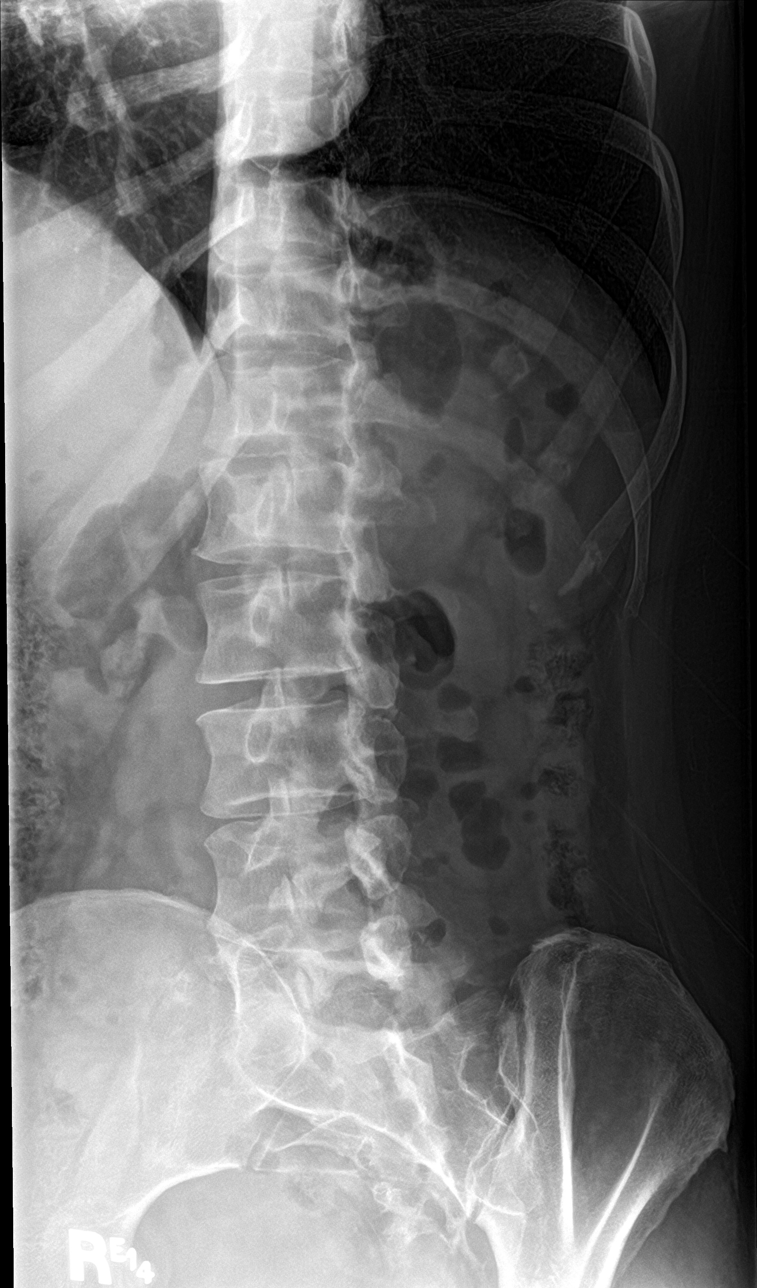

[l-spine obl (2 of 2)]
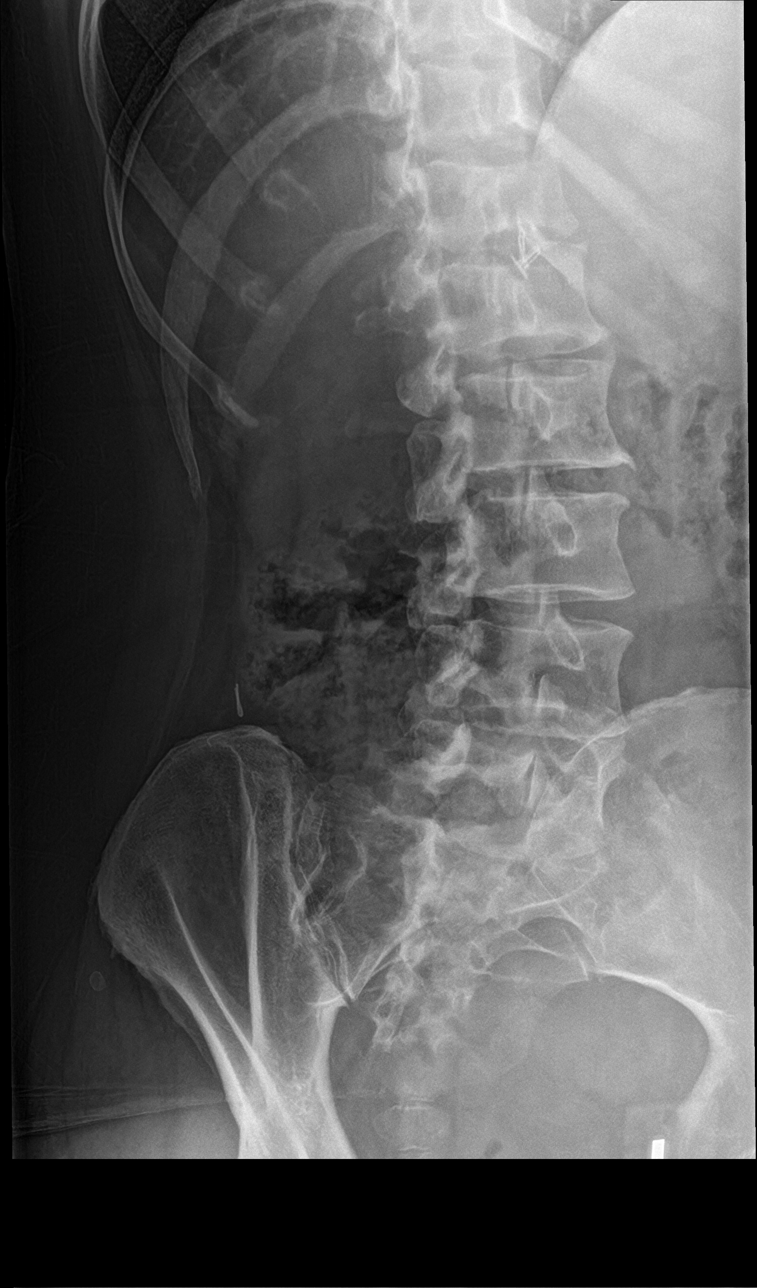

[l-spine lat]
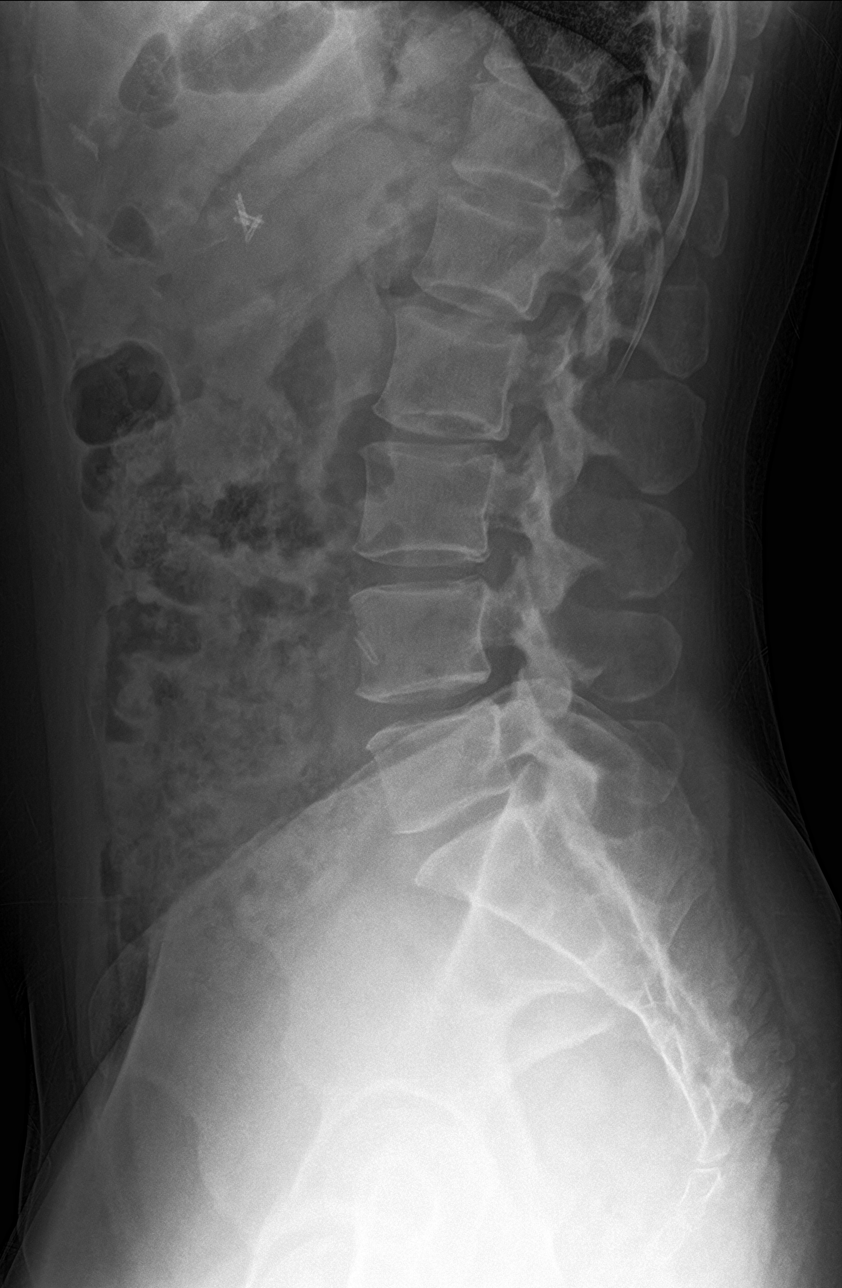

[l-spine spot]
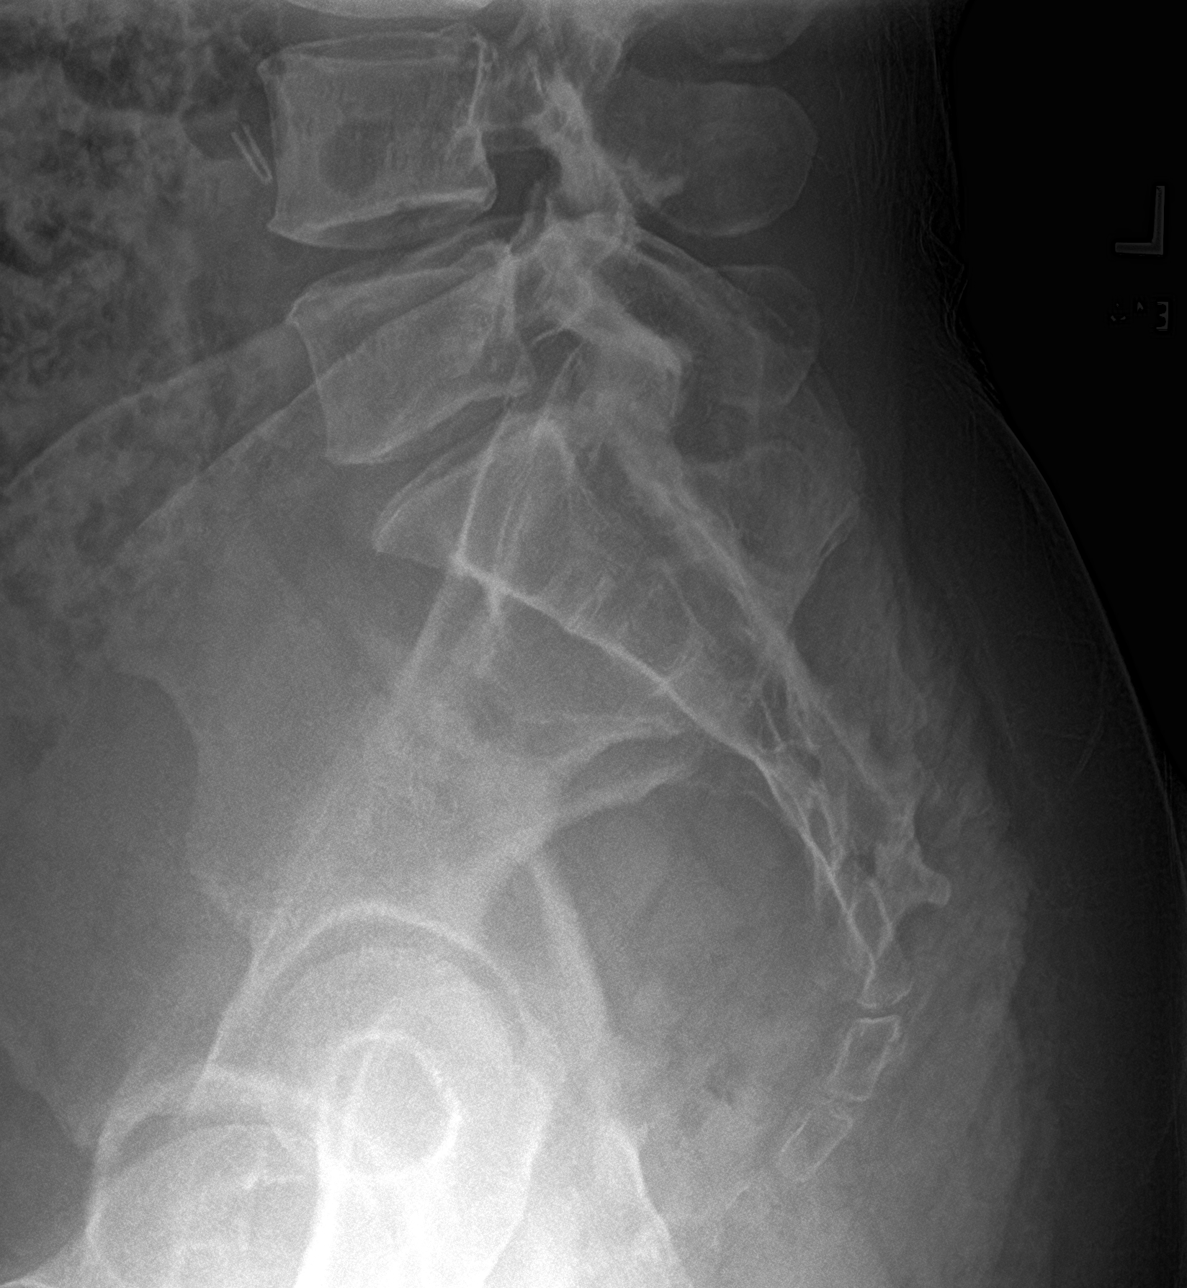

[5 of 5 positions shown; findings below may reference images not displayed]

FINDINGS: Five lumbar type vertebra. There is no acute fracture or subluxation
of the lumbar spine. The vertebral body heights maintained. The
visualized posterior elements are intact. The soft tissues are
unremarkable. Right upper quadrant cholecystectomy clips.
IMPRESSION: Negative.

## 2022-12-21 ENCOUNTER — Encounter: Payer: Self-pay | Admitting: *Deleted
